# Patient Record
Sex: Female | Born: 1960 | Race: Black or African American | Hispanic: No | Smoking: Former smoker
Health system: Southern US, Community
[De-identification: ages and names within clinical notes are randomized; demographics above are authoritative.]

## PROBLEM LIST (undated history)

## (undated) DIAGNOSIS — K219 Gastro-esophageal reflux disease without esophagitis: Secondary | ICD-10-CM

## (undated) DIAGNOSIS — R198 Other specified symptoms and signs involving the digestive system and abdomen: Secondary | ICD-10-CM

## (undated) DIAGNOSIS — F419 Anxiety disorder, unspecified: Secondary | ICD-10-CM

## (undated) DIAGNOSIS — I10 Essential (primary) hypertension: Secondary | ICD-10-CM

## (undated) DIAGNOSIS — K7581 Nonalcoholic steatohepatitis (NASH): Secondary | ICD-10-CM

## (undated) DIAGNOSIS — F29 Unspecified psychosis not due to a substance or known physiological condition: Secondary | ICD-10-CM

## (undated) DIAGNOSIS — E669 Obesity, unspecified: Secondary | ICD-10-CM

## (undated) HISTORY — DX: Nonalcoholic steatohepatitis (NASH): K75.81

## (undated) HISTORY — PX: CHOLECYSTECTOMY: SHX55

## (undated) HISTORY — PX: OTHER SURGICAL HISTORY: SHX169

## (undated) HISTORY — PX: ABDOMINAL HYSTERECTOMY: SHX81

## (undated) HISTORY — PX: ABDOMINAL SURGERY: SHX537

## (undated) HISTORY — DX: Obesity, unspecified: E66.9

## (undated) HISTORY — DX: Anxiety disorder, unspecified: F41.9

## (undated) HISTORY — DX: Other specified symptoms and signs involving the digestive system and abdomen: R19.8

## (undated) HISTORY — DX: Gastro-esophageal reflux disease without esophagitis: K21.9

## (undated) HISTORY — DX: Unspecified psychosis not due to a substance or known physiological condition: F29

## (undated) HISTORY — DX: Essential (primary) hypertension: I10

---

## 2001-08-24 ENCOUNTER — Other Ambulatory Visit: Admission: RE | Admit: 2001-08-24 | Discharge: 2001-08-24 | Payer: Self-pay | Admitting: Family Medicine

## 2001-10-21 ENCOUNTER — Ambulatory Visit (HOSPITAL_COMMUNITY): Admission: RE | Admit: 2001-10-21 | Discharge: 2001-10-21 | Payer: Self-pay | Admitting: Family Medicine

## 2001-10-21 ENCOUNTER — Encounter: Payer: Self-pay | Admitting: Family Medicine

## 2003-06-20 ENCOUNTER — Ambulatory Visit (HOSPITAL_COMMUNITY): Admission: RE | Admit: 2003-06-20 | Discharge: 2003-06-20 | Payer: Self-pay | Admitting: Family Medicine

## 2004-05-14 ENCOUNTER — Ambulatory Visit: Payer: Self-pay | Admitting: Family Medicine

## 2004-06-18 ENCOUNTER — Ambulatory Visit: Payer: Self-pay | Admitting: Family Medicine

## 2004-07-25 ENCOUNTER — Ambulatory Visit: Payer: Self-pay | Admitting: Family Medicine

## 2004-08-07 ENCOUNTER — Ambulatory Visit (HOSPITAL_COMMUNITY): Admission: RE | Admit: 2004-08-07 | Discharge: 2004-08-07 | Payer: Self-pay | Admitting: Family Medicine

## 2004-10-08 ENCOUNTER — Ambulatory Visit: Payer: Self-pay | Admitting: Family Medicine

## 2004-10-10 ENCOUNTER — Emergency Department (HOSPITAL_COMMUNITY): Admission: EM | Admit: 2004-10-10 | Discharge: 2004-10-10 | Payer: Self-pay | Admitting: Emergency Medicine

## 2004-10-14 ENCOUNTER — Ambulatory Visit: Payer: Self-pay | Admitting: Psychiatry

## 2004-11-18 ENCOUNTER — Ambulatory Visit: Payer: Self-pay | Admitting: Family Medicine

## 2004-11-18 ENCOUNTER — Inpatient Hospital Stay (HOSPITAL_COMMUNITY): Admission: AD | Admit: 2004-11-18 | Discharge: 2004-11-26 | Payer: Self-pay | Admitting: Urology

## 2004-12-12 ENCOUNTER — Ambulatory Visit: Payer: Self-pay | Admitting: Psychiatry

## 2004-12-13 ENCOUNTER — Ambulatory Visit: Payer: Self-pay | Admitting: Family Medicine

## 2005-07-08 ENCOUNTER — Ambulatory Visit: Payer: Self-pay | Admitting: Family Medicine

## 2005-11-18 ENCOUNTER — Emergency Department (HOSPITAL_COMMUNITY): Admission: EM | Admit: 2005-11-18 | Discharge: 2005-11-18 | Payer: Self-pay | Admitting: Emergency Medicine

## 2005-11-19 ENCOUNTER — Encounter: Payer: Self-pay | Admitting: Family Medicine

## 2005-11-19 ENCOUNTER — Other Ambulatory Visit: Admission: RE | Admit: 2005-11-19 | Discharge: 2005-11-19 | Payer: Self-pay | Admitting: Family Medicine

## 2005-11-19 ENCOUNTER — Ambulatory Visit: Payer: Self-pay | Admitting: Family Medicine

## 2005-11-20 ENCOUNTER — Ambulatory Visit (HOSPITAL_COMMUNITY): Admission: RE | Admit: 2005-11-20 | Discharge: 2005-11-20 | Payer: Self-pay | Admitting: Family Medicine

## 2006-03-23 ENCOUNTER — Ambulatory Visit: Payer: Self-pay | Admitting: Family Medicine

## 2006-06-17 ENCOUNTER — Ambulatory Visit: Payer: Self-pay | Admitting: Family Medicine

## 2006-07-07 ENCOUNTER — Emergency Department (HOSPITAL_COMMUNITY): Admission: EM | Admit: 2006-07-07 | Discharge: 2006-07-07 | Payer: Self-pay | Admitting: Emergency Medicine

## 2006-07-30 ENCOUNTER — Ambulatory Visit: Payer: Self-pay | Admitting: Family Medicine

## 2006-08-27 ENCOUNTER — Inpatient Hospital Stay (HOSPITAL_COMMUNITY): Admission: RE | Admit: 2006-08-27 | Discharge: 2006-08-29 | Payer: Self-pay | Admitting: Obstetrics and Gynecology

## 2006-08-27 ENCOUNTER — Encounter (INDEPENDENT_AMBULATORY_CARE_PROVIDER_SITE_OTHER): Payer: Self-pay | Admitting: Specialist

## 2006-11-16 ENCOUNTER — Encounter: Payer: Self-pay | Admitting: Family Medicine

## 2006-11-16 LAB — CONVERTED CEMR LAB
BUN: 13 mg/dL (ref 6–23)
CO2: 23 meq/L (ref 19–32)
Chloride: 108 meq/L (ref 96–112)
Eosinophils Absolute: 0.1 10*3/uL (ref 0.0–0.7)
Glucose, Bld: 94 mg/dL (ref 70–99)
HCT: 40.8 % (ref 36.0–46.0)
Hemoglobin: 13.6 g/dL (ref 12.0–15.0)
LDL Cholesterol: 115 mg/dL — ABNORMAL HIGH (ref 0–99)
MCHC: 33.3 g/dL (ref 30.0–36.0)
Neutrophils Relative %: 30 % — ABNORMAL LOW (ref 43–77)
Platelets: 274 10*3/uL (ref 150–400)
Potassium: 3.5 meq/L (ref 3.5–5.3)
RBC: 4.42 M/uL (ref 3.87–5.11)
RDW: 13.1 % (ref 11.5–14.0)
Sodium: 143 meq/L (ref 135–145)
TSH: 0.761 microintl units/mL
TSH: 0.761 microintl units/mL (ref 0.350–5.50)
Total CHOL/HDL Ratio: 3.5
WBC: 5.5 10*3/uL (ref 4.0–10.5)

## 2006-11-24 ENCOUNTER — Encounter: Payer: Self-pay | Admitting: Family Medicine

## 2006-11-24 ENCOUNTER — Ambulatory Visit: Payer: Self-pay | Admitting: Family Medicine

## 2006-11-24 ENCOUNTER — Other Ambulatory Visit: Admission: RE | Admit: 2006-11-24 | Discharge: 2006-11-24 | Payer: Self-pay | Admitting: Family Medicine

## 2006-11-24 LAB — CONVERTED CEMR LAB: Pap Smear: NORMAL

## 2006-11-25 ENCOUNTER — Encounter: Payer: Self-pay | Admitting: Family Medicine

## 2006-11-25 LAB — CONVERTED CEMR LAB
Chlamydia, DNA Probe: NEGATIVE
Gardnerella vaginalis: NEGATIVE

## 2006-11-30 ENCOUNTER — Ambulatory Visit (HOSPITAL_COMMUNITY): Admission: RE | Admit: 2006-11-30 | Discharge: 2006-11-30 | Payer: Self-pay | Admitting: Family Medicine

## 2007-04-09 ENCOUNTER — Ambulatory Visit: Payer: Self-pay | Admitting: Family Medicine

## 2007-04-26 ENCOUNTER — Ambulatory Visit: Payer: Self-pay | Admitting: Family Medicine

## 2007-07-20 ENCOUNTER — Encounter: Payer: Self-pay | Admitting: Family Medicine

## 2007-07-20 DIAGNOSIS — F99 Mental disorder, not otherwise specified: Secondary | ICD-10-CM | POA: Insufficient documentation

## 2007-07-20 DIAGNOSIS — I10 Essential (primary) hypertension: Secondary | ICD-10-CM | POA: Insufficient documentation

## 2007-07-20 DIAGNOSIS — F411 Generalized anxiety disorder: Secondary | ICD-10-CM | POA: Insufficient documentation

## 2007-08-27 ENCOUNTER — Ambulatory Visit: Payer: Self-pay | Admitting: Family Medicine

## 2007-08-27 LAB — CONVERTED CEMR LAB: Helicobacter Pylori Antibody-IgG: 0.8

## 2007-09-29 ENCOUNTER — Ambulatory Visit: Payer: Self-pay | Admitting: Gastroenterology

## 2007-10-12 ENCOUNTER — Ambulatory Visit: Payer: Self-pay | Admitting: Gastroenterology

## 2007-10-18 ENCOUNTER — Ambulatory Visit: Payer: Self-pay | Admitting: Gastroenterology

## 2007-10-29 HISTORY — PX: ESOPHAGOGASTRODUODENOSCOPY: SHX1529

## 2007-11-16 ENCOUNTER — Ambulatory Visit: Payer: Self-pay | Admitting: Gastroenterology

## 2007-11-17 ENCOUNTER — Ambulatory Visit (HOSPITAL_COMMUNITY): Admission: RE | Admit: 2007-11-17 | Discharge: 2007-11-17 | Payer: Self-pay | Admitting: Gastroenterology

## 2007-11-23 ENCOUNTER — Encounter: Payer: Self-pay | Admitting: Gastroenterology

## 2007-11-23 ENCOUNTER — Ambulatory Visit: Payer: Self-pay | Admitting: Gastroenterology

## 2007-11-23 ENCOUNTER — Ambulatory Visit (HOSPITAL_COMMUNITY): Admission: RE | Admit: 2007-11-23 | Discharge: 2007-11-23 | Payer: Self-pay | Admitting: Gastroenterology

## 2007-12-10 ENCOUNTER — Ambulatory Visit: Payer: Self-pay | Admitting: Family Medicine

## 2007-12-10 LAB — CONVERTED CEMR LAB
Calcium: 9.6 mg/dL (ref 8.4–10.5)
Sodium: 141 meq/L (ref 135–145)
Total CHOL/HDL Ratio: 3.8
Triglycerides: 85 mg/dL (ref <150)
VLDL: 17 mg/dL (ref 0–40)

## 2008-02-24 ENCOUNTER — Encounter: Payer: Self-pay | Admitting: Family Medicine

## 2008-02-24 ENCOUNTER — Ambulatory Visit: Payer: Self-pay | Admitting: Gastroenterology

## 2008-03-14 ENCOUNTER — Ambulatory Visit: Payer: Self-pay | Admitting: Family Medicine

## 2008-03-14 DIAGNOSIS — F29 Unspecified psychosis not due to a substance or known physiological condition: Secondary | ICD-10-CM | POA: Insufficient documentation

## 2008-03-16 DIAGNOSIS — K219 Gastro-esophageal reflux disease without esophagitis: Secondary | ICD-10-CM | POA: Insufficient documentation

## 2008-05-15 DIAGNOSIS — R7401 Elevation of levels of liver transaminase levels: Secondary | ICD-10-CM | POA: Insufficient documentation

## 2008-05-15 DIAGNOSIS — R740 Nonspecific elevation of levels of transaminase and lactic acid dehydrogenase [LDH]: Secondary | ICD-10-CM

## 2008-05-15 DIAGNOSIS — R109 Unspecified abdominal pain: Secondary | ICD-10-CM | POA: Insufficient documentation

## 2008-05-15 DIAGNOSIS — K7689 Other specified diseases of liver: Secondary | ICD-10-CM | POA: Insufficient documentation

## 2008-05-18 ENCOUNTER — Encounter: Payer: Self-pay | Admitting: Gastroenterology

## 2008-05-18 LAB — CONVERTED CEMR LAB
Albumin: 5 g/dL (ref 3.5–5.2)
Bilirubin, Direct: 0.1 mg/dL (ref 0.0–0.3)
Total Bilirubin: 0.5 mg/dL (ref 0.3–1.2)

## 2008-06-01 ENCOUNTER — Encounter: Payer: Self-pay | Admitting: Family Medicine

## 2008-06-30 HISTORY — PX: COLONOSCOPY: SHX174

## 2008-07-04 ENCOUNTER — Encounter: Payer: Self-pay | Admitting: Family Medicine

## 2008-07-06 ENCOUNTER — Ambulatory Visit: Payer: Self-pay | Admitting: Gastroenterology

## 2008-07-06 LAB — CONVERTED CEMR LAB
ALT: 37 units/L — ABNORMAL HIGH (ref 0–35)
AST: 23 units/L (ref 0–37)
Alkaline Phosphatase: 81 units/L (ref 39–117)
Bilirubin, Direct: 0.1 mg/dL (ref 0.0–0.3)
HCT: 42 % (ref 36.0–46.0)
Indirect Bilirubin: 0.3 mg/dL (ref 0.0–0.9)
Total Protein: 7.7 g/dL (ref 6.0–8.3)

## 2008-07-10 ENCOUNTER — Ambulatory Visit: Payer: Self-pay | Admitting: Gastroenterology

## 2008-07-10 ENCOUNTER — Ambulatory Visit (HOSPITAL_COMMUNITY): Admission: RE | Admit: 2008-07-10 | Discharge: 2008-07-10 | Payer: Self-pay | Admitting: Gastroenterology

## 2008-07-11 ENCOUNTER — Ambulatory Visit (HOSPITAL_COMMUNITY): Admission: RE | Admit: 2008-07-11 | Discharge: 2008-07-11 | Payer: Self-pay | Admitting: Gastroenterology

## 2008-07-12 ENCOUNTER — Ambulatory Visit: Payer: Self-pay | Admitting: Gastroenterology

## 2008-07-12 HISTORY — PX: OTHER SURGICAL HISTORY: SHX169

## 2008-07-24 ENCOUNTER — Ambulatory Visit: Payer: Self-pay | Admitting: Family Medicine

## 2008-08-07 ENCOUNTER — Ambulatory Visit: Payer: Self-pay | Admitting: Family Medicine

## 2008-08-15 ENCOUNTER — Telehealth: Payer: Self-pay | Admitting: Family Medicine

## 2008-08-16 ENCOUNTER — Encounter: Payer: Self-pay | Admitting: Family Medicine

## 2008-09-04 ENCOUNTER — Encounter: Payer: Self-pay | Admitting: Family Medicine

## 2008-09-13 ENCOUNTER — Ambulatory Visit: Payer: Self-pay | Admitting: Gastroenterology

## 2008-10-02 ENCOUNTER — Encounter: Payer: Self-pay | Admitting: Family Medicine

## 2008-10-27 ENCOUNTER — Encounter: Payer: Self-pay | Admitting: Family Medicine

## 2008-11-02 ENCOUNTER — Encounter: Payer: Self-pay | Admitting: Gastroenterology

## 2008-11-28 ENCOUNTER — Encounter: Payer: Self-pay | Admitting: Family Medicine

## 2008-12-20 ENCOUNTER — Ambulatory Visit: Payer: Self-pay | Admitting: Family Medicine

## 2008-12-25 ENCOUNTER — Ambulatory Visit (HOSPITAL_COMMUNITY): Admission: RE | Admit: 2008-12-25 | Discharge: 2008-12-25 | Payer: Self-pay | Admitting: Family Medicine

## 2008-12-25 ENCOUNTER — Encounter: Payer: Self-pay | Admitting: Family Medicine

## 2008-12-25 DIAGNOSIS — R5381 Other malaise: Secondary | ICD-10-CM | POA: Insufficient documentation

## 2008-12-25 DIAGNOSIS — R5383 Other fatigue: Secondary | ICD-10-CM

## 2008-12-26 LAB — CONVERTED CEMR LAB
Albumin: 4.8 g/dL (ref 3.5–5.2)
CO2: 20 meq/L (ref 19–32)
Calcium: 9.7 mg/dL (ref 8.4–10.5)
Chloride: 106 meq/L (ref 96–112)
Cholesterol: 188 mg/dL (ref 0–200)
Indirect Bilirubin: 0.2 mg/dL (ref 0.0–0.9)
LDL Cholesterol: 111 mg/dL — ABNORMAL HIGH (ref 0–99)
Sodium: 140 meq/L (ref 135–145)
Total Bilirubin: 0.3 mg/dL (ref 0.3–1.2)
Total CHOL/HDL Ratio: 3.2
Triglycerides: 97 mg/dL (ref <150)
VLDL: 19 mg/dL (ref 0–40)

## 2008-12-29 ENCOUNTER — Encounter: Payer: Self-pay | Admitting: Family Medicine

## 2009-01-30 ENCOUNTER — Encounter: Payer: Self-pay | Admitting: Family Medicine

## 2009-02-02 ENCOUNTER — Encounter: Payer: Self-pay | Admitting: Family Medicine

## 2009-02-26 ENCOUNTER — Encounter: Payer: Self-pay | Admitting: Gastroenterology

## 2009-02-28 ENCOUNTER — Encounter: Payer: Self-pay | Admitting: Family Medicine

## 2009-03-09 ENCOUNTER — Encounter: Payer: Self-pay | Admitting: Gastroenterology

## 2009-03-22 ENCOUNTER — Ambulatory Visit: Payer: Self-pay | Admitting: Family Medicine

## 2009-04-01 DIAGNOSIS — E669 Obesity, unspecified: Secondary | ICD-10-CM

## 2009-04-01 DIAGNOSIS — E663 Overweight: Secondary | ICD-10-CM | POA: Insufficient documentation

## 2009-04-18 ENCOUNTER — Encounter (INDEPENDENT_AMBULATORY_CARE_PROVIDER_SITE_OTHER): Payer: Self-pay

## 2009-04-27 ENCOUNTER — Encounter: Payer: Self-pay | Admitting: Family Medicine

## 2009-04-27 ENCOUNTER — Encounter: Payer: Self-pay | Admitting: Gastroenterology

## 2009-05-10 ENCOUNTER — Encounter: Payer: Self-pay | Admitting: Family Medicine

## 2009-05-31 ENCOUNTER — Encounter: Payer: Self-pay | Admitting: Family Medicine

## 2009-06-28 ENCOUNTER — Encounter: Payer: Self-pay | Admitting: Family Medicine

## 2009-08-31 ENCOUNTER — Encounter: Payer: Self-pay | Admitting: Family Medicine

## 2009-10-01 ENCOUNTER — Ambulatory Visit: Payer: Self-pay | Admitting: Family Medicine

## 2009-10-01 DIAGNOSIS — E8941 Symptomatic postprocedural ovarian failure: Secondary | ICD-10-CM | POA: Insufficient documentation

## 2010-01-29 ENCOUNTER — Encounter (INDEPENDENT_AMBULATORY_CARE_PROVIDER_SITE_OTHER): Payer: Self-pay | Admitting: *Deleted

## 2010-01-30 LAB — CONVERTED CEMR LAB
AST: 22 units/L (ref 0–37)
Albumin: 4.9 g/dL (ref 3.5–5.2)
BUN: 16 mg/dL (ref 6–23)
CO2: 20 meq/L (ref 19–32)
Calcium: 9.8 mg/dL (ref 8.4–10.5)
Cholesterol: 194 mg/dL (ref 0–200)
HCT: 41 % (ref 36.0–46.0)
HDL: 57 mg/dL (ref >39)
Hemoglobin: 14.1 g/dL (ref 12.0–15.0)
LDL Cholesterol: 123 mg/dL — ABNORMAL HIGH (ref 0–99)
MCHC: 34.4 g/dL (ref 30.0–36.0)
MCV: 92.8 fL (ref 78.0–100.0)
Platelets: 280 10*3/uL (ref 150–400)
RBC: 4.42 M/uL (ref 3.87–5.11)
RDW: 12.8 % (ref 11.5–15.5)
Total CHOL/HDL Ratio: 3.4
VLDL: 14 mg/dL (ref 0–40)
Vit D, 25-Hydroxy: 42 ng/mL (ref 30–89)

## 2010-02-01 ENCOUNTER — Ambulatory Visit: Payer: Self-pay | Admitting: Family Medicine

## 2010-02-03 DIAGNOSIS — E785 Hyperlipidemia, unspecified: Secondary | ICD-10-CM | POA: Insufficient documentation

## 2010-02-05 ENCOUNTER — Ambulatory Visit (HOSPITAL_COMMUNITY): Admission: RE | Admit: 2010-02-05 | Discharge: 2010-02-05 | Payer: Self-pay | Admitting: Family Medicine

## 2010-02-05 ENCOUNTER — Ambulatory Visit: Payer: Self-pay | Admitting: Family Medicine

## 2010-02-27 ENCOUNTER — Encounter: Payer: Self-pay | Admitting: Gastroenterology

## 2010-03-05 ENCOUNTER — Telehealth (INDEPENDENT_AMBULATORY_CARE_PROVIDER_SITE_OTHER): Payer: Self-pay

## 2010-04-03 ENCOUNTER — Encounter: Payer: Self-pay | Admitting: Gastroenterology

## 2010-04-30 LAB — CONVERTED CEMR LAB: OCCULT 1: NEGATIVE

## 2010-05-10 ENCOUNTER — Ambulatory Visit: Payer: Self-pay | Admitting: Family Medicine

## 2010-05-10 ENCOUNTER — Other Ambulatory Visit: Admission: RE | Admit: 2010-05-10 | Discharge: 2010-05-10 | Payer: Self-pay | Admitting: Family Medicine

## 2010-05-29 ENCOUNTER — Encounter (INDEPENDENT_AMBULATORY_CARE_PROVIDER_SITE_OTHER): Payer: Self-pay | Admitting: *Deleted

## 2010-07-09 ENCOUNTER — Telehealth: Payer: Self-pay | Admitting: Family Medicine

## 2010-07-15 ENCOUNTER — Encounter: Payer: Self-pay | Admitting: Family Medicine

## 2010-07-30 NOTE — Assessment & Plan Note (Signed)
Summary: follow up- room 1   Vital Signs:  Patient profile:   50 year old female Menstrual status:  hysterectomy Height:      64 inches Weight:      156.25 pounds BMI:     26.92 O2 Sat:      98 % on Room air Pulse rate:   93 / minute Resp:     16 per minute BP sitting:   124 / 70  (left arm)  Vitals Entered By: Adella Hare LPN (October 01, 452 10:27 AM) CC: follow up Is Patient Diabetic? No Pain Assessment Patient in pain? no        Primary Provider:  Syliva Overman, MD  CC:  follow up.  History of Present Illness: Pt is here for follow up of chronic problems.  She is accompanied by her caregiver. Eye & dental exams UTD. No current complaints/concerns. Has been feeling well.  She denies any chest pain, palp, difficulty breathing, abd pain, or constipation. she does need a refill of her medications.  Current Medications (verified): 1)  Clonazepam Odt 0.5 Mg  Tbdp (Clonazepam) .... Take One Tablet By Mouth Twice A Day 2)  Topamax 100 Mg  Tabs (Topiramate) .... Take One Tablet By Mouth Once A Day 3)  Zyprexa 7.5 Mg  Tabs (Olanzapine) .... Take One Tablet By Mouth At Bedtime 4)  Estradiol 1 Mg  Tabs (Estradiol) .... Take One Tablet By Mouth Once A Day 5)  Dexilant 60 Mg Cpdr (Dexlansoprazole) .... One By Mouth Daily 6)  Docusate Sodium 100 Mg Caps (Docusate Sodium) .... One Cap By Mouth Two Times A Day 7)  Hydrochlorothiazide 12.5 Mg Caps (Hydrochlorothiazide) .... One Cap By Mouth Qd 8)  Citracal/vitamin D 250-200 Mg-Unit Tabs (Calcium Citrate-Vitamin D) .... Two Tabs By Mouth Bid 9)  Benefiber  Tabs (Wheat Dextrin) .... One By Mouth Twice A Day 10)  Lisinopril 20 Mg Tabs (Lisinopril) .... One Tab By Mouth Qd  Allergies (verified): No Known Drug Allergies  Past History:  Past medical history reviewed for relevance to current acute and chronic problems.  Past Medical History: Reviewed history from 07/20/2007 and no changes required. hypertension psychotic  disorder anxiety mild obesity  Review of Systems General:  Denies chills, fever, and loss of appetite. ENT:  Denies earache, nasal congestion, and sore throat. CV:  Denies chest pain or discomfort and palpitations. Resp:  Denies cough and shortness of breath. GI:  Denies abdominal pain, change in bowel habits, constipation, indigestion, nausea, and vomiting. GU:  Denies dysuria, incontinence, and urinary frequency. Psych:  Denies anxiety and irritability.  Physical Exam  General:  Well-developed,well-nourished,in no acute distress; alert,appropriate and cooperative throughout examination Head:  Normocephalic and atraumatic without obvious abnormalities. No apparent alopecia or balding. Ears:  External ear exam shows no significant lesions or deformities.  Otoscopic examination reveals clear canals, tympanic membranes are intact bilaterally without bulging, retraction, inflammation or discharge. Hearing is grossly normal bilaterally. Nose:  External nasal examination shows no deformity or inflammation. Nasal mucosa are pink and moist without lesions or exudates. Mouth:  Oral mucosa and oropharynx without lesions or exudates.   Neck:  No deformities, masses, or tenderness noted.no thyromegaly.   Lungs:  Normal respiratory effort, chest expands symmetrically. Lungs are clear to auscultation, no crackles or wheezes. Heart:  Normal rate and regular rhythm. S1 and S2 normal without gallop, murmur, click, rub or other extra sounds. Abdomen:  Bowel sounds positive,abdomen soft and non-tender without masses, organomegaly or hernias  noted. Pulses:  R radial normal, R posterior tibial normal, R dorsalis pedis normal, L radial normal, L posterior tibial normal, and L dorsalis pedis normal.   Extremities:  No clubbing, cyanosis, edema, or deformity noted with normal full range of motion of all joints.   Neurologic:  sensation intact to light touch, gait normal, and DTRs symmetrical and normal.   Skin:   Intact without suspicious lesions or rashes Cervical Nodes:  No lymphadenopathy noted Psych:  good eye contact and not anxious appearing.     Impression & Recommendations:  Problem # 1:  ESSENTIAL HYPERTENSION, BENIGN (ICD-401.1) Assessment Unchanged  Her updated medication list for this problem includes:    Hydrochlorothiazide 12.5 Mg Caps (Hydrochlorothiazide) ..... One cap by mouth qd    Lisinopril 20 Mg Tabs (Lisinopril) ..... One tab by mouth qd  BP today: 124/70 Prior BP: 120/80 (03/22/2009)  Labs Reviewed: K+: 3.9 (12/25/2008) Creat: : 1.02 (12/25/2008)   Chol: 188 (12/25/2008)   HDL: 58 (12/25/2008)   LDL: 111 (12/25/2008)   TG: 97 (12/25/2008)  Orders: T-Comprehensive Metabolic Panel ([id number])  Problem # 2:  MENOPAUSE, SURGICAL (ICD-627.4) Will try decreasing her estradiol dosage.  The following medications were removed from the medication list:    Estradiol 1 Mg Tabs (Estradiol) .Marland Kitchen... Take one tablet by mouth once a day Her updated medication list for this problem includes:    Estradiol 0.5 Mg Tabs (Estradiol) .Marland Kitchen... Take 1 daily  Problem # 3:  GERD (ICD-530.81) Assessment: Improved  Her updated medication list for this problem includes:    Dexilant 60 Mg Cpdr (Dexlansoprazole) ..... One by mouth daily  Problem # 4:  OVERWEIGHT (ICD-278.02) Assessment: Deteriorated Discussed with caregiver need to improve diet to help with wt loss. Discussed d/c sweets & decrease carbs.  Try to increase her veggie & fruit intake. Orders: T-Lipid Profile ([id number])  Ht: 64 (10/01/2009)   Wt: 156.25 (10/01/2009)   BMI: 26.92 (10/01/2009)  Complete Medication List: 1)  Clonazepam Odt 0.5 Mg Tbdp (Clonazepam) .... Take one tablet by mouth twice a day 2)  Topamax 100 Mg Tabs (Topiramate) .... Take one tablet by mouth once a day 3)  Zyprexa 7.5 Mg Tabs (Olanzapine) .... Take one tablet by mouth at bedtime 4)  Dexilant 60 Mg Cpdr (Dexlansoprazole) .... One by mouth  daily 5)  Docusate Sodium 100 Mg Caps (Docusate sodium) .... One cap by mouth two times a day 6)  Hydrochlorothiazide 12.5 Mg Caps (Hydrochlorothiazide) .... One cap by mouth qd 7)  Citracal/vitamin D 250-200 Mg-unit Tabs (Calcium citrate-vitamin d) .... Two tabs by mouth bid 8)  Benefiber Tabs (Wheat dextrin) .... One by mouth twice a day 9)  Lisinopril 20 Mg Tabs (Lisinopril) .... One tab by mouth qd 10)  Estradiol 0.5 Mg Tabs (Estradiol) .... Take 1 daily  Other Orders: T-CBC No Diff ([id number]) T-TSH [phone]) T-Vitamin D (25-Hydroxy) [phone])  Patient Instructions: 1)  Please schedule a follow-up appointment in 4 months. 2)  It is important that you exercise regularly at least 20 minutes 5 times a week. If you develop chest pain, have severe difficulty breathing, or feel very tired , stop exercising immediately and seek medical attention. 3)  You need to lose weight. Consider a lower calorie diet and regular exercise.  4)  I have lowered your Estradiol dosage from 1.0 mg to  0.5 mg daily. 5)  I have ordered blood work for you to have drawn fasting. Prescriptions: CLONAZEPAM ODT 0.5 MG  TBDP (CLONAZEPAM) Take  one tablet by mouth twice a day  #60 x 5   Entered and Authorized by:   Esperanza Sheets PA   Signed by:   Esperanza Sheets PA on 10/01/2009   Method used:   Printed then faxed to ...       [facility] (retail)       [address]       [place], [place]  [address]       Ph: [phone]       Fax: [phone]   RxID:   [id number] HYDROCHLOROTHIAZIDE 12.5 MG CAPS (HYDROCHLOROTHIAZIDE) ONE CAP by mouth QD  #30 x 5   Entered and Authorized by:   Esperanza Sheets PA   Signed by:   Esperanza Sheets PA on 10/01/2009   Method used:   Electronically to        [facility] (retail)       [address]       [place], [place]  [address]       Ph: [phone]       Fax: [phone]   RxID:    [id number] LISINOPRIL 20 MG TABS (LISINOPRIL) one tab by mouth qd  #30 x 5   Entered and Authorized by:   Esperanza Sheets PA   Signed by:   Esperanza Sheets PA on 10/01/2009   Method used:   Electronically to        [facility] (retail)       [address]       [place], [place]  [address]       Ph: [phone]       Fax: [phone]   RxID:   [id number] ZYPREXA 7.5 MG  TABS (OLANZAPINE) Take one tablet by mouth at bedtime  #30 x 5   Entered and Authorized by:   Esperanza Sheets PA   Signed by:   Esperanza Sheets PA on 10/01/2009   Method used:   Electronically to        [facility] (retail)       [address]       [place], [place]  [address]       Ph: [phone]       Fax: [phone]   RxID:   [id number] TOPAMAX 100 MG  TABS (TOPIRAMATE) Take one tablet by mouth once a day  #30 x 5   Entered and Authorized by:   Esperanza Sheets PA   Signed by:   Esperanza Sheets PA on 10/01/2009   Method used:   Electronically to        [facility] (retail)       [address]       [place], [place]  [address]       Ph: [phone]       Fax: [phone]   RxID:   [id number] ESTRADIOL 0.5 MG TABS (ESTRADIOL) take 1 daily  #30 x 5   Entered and Authorized by:   Esperanza Sheets PA   Signed by:   Esperanza Sheets PA on 10/01/2009   Method used:   Electronically to        [facility] (retail)       [address]  [place], [place]  [address]       Ph: [phone]       Fax: [phone]   RxID:   [id number]

## 2010-07-30 NOTE — Medication Information (Signed)
Summary: Berna Bue   Imported By: Rexene Alberts 01/29/2010 13:51:42  _____________________________________________________________________  External Attachment:    Type:   Image     Comment:   External Document

## 2010-07-30 NOTE — Assessment & Plan Note (Signed)
Summary: physical/slj   Vital Signs:  Patient profile:   50 year old female Menstrual status:  hysterectomy Height:      64 inches Weight:      153.25 pounds O2 Sat:      97 % on Room air Pulse rate:   92 / minute Pulse rhythm:   regular Resp:     16 per minute BP sitting:   110 / 80  (right arm)  Vitals Entered By: Louie Casa CMA (May 10, 2010 10:50 AM)  O2 Flow:  Room air CC: physical   Primary Care Provider:  Tula Nakayama, MD  CC:  physical.  History of Present Illness: Reports  that she has been  doing well. Denies recent fever or chills. Denies sinus pressure, nasal congestion , ear pain or sore throat. Denies chest congestion, or cough productive of sputum. Denies chest pain, palpitations, PND, orthopnea or leg swelling. Denies abdominal pain, nausea, vomitting, diarrhea or constipation. Denies change in bowel movements or bloody stool. Denies dysuria , frequency, incontinence or hesitancy. Denies  joint pain, swelling, or reduced mobility. Denies headaches, vertigo, seizures. Denies depression, anxiety or insomnia. Denies  rash, lesions, or itch.     Current Medications (verified): 1)  Clonazepam Odt 0.5 Mg  Tbdp (Clonazepam) .... Take One Tablet By Mouth Twice A Day 2)  Topamax 100 Mg  Tabs (Topiramate) .... Take One Tablet By Mouth Once A Day 3)  Zyprexa 7.5 Mg  Tabs (Olanzapine) .... Take One Tablet By Mouth At Bedtime 4)  Dexilant 60 Mg Cpdr (Dexlansoprazole) .... One By Mouth Daily 5)  Docusate Sodium 100 Mg Caps (Docusate Sodium) .... One Cap By Mouth Two Times A Day 6)  Hydrochlorothiazide 12.5 Mg Caps (Hydrochlorothiazide) .... One Cap By Mouth Qd 7)  Citracal/vitamin D 250-200 Mg-Unit Tabs (Calcium Citrate-Vitamin D) .... Two Tabs By Mouth Bid 8)  Benefiber  Tabs (Wheat Dextrin) .... One By Mouth Twice A Day 9)  Lisinopril 20 Mg Tabs (Lisinopril) .... One Tab By Mouth Qd 10)  Estradiol 0.5 Mg Tabs (Estradiol) .... Take 1 Daily 11)   Travatan Z 0.004 % Soln (Travoprost) .... One Drop in Each Eye At Bedtime  Allergies (verified): No Known Drug Allergies  Review of Systems      See HPI General:  Complains of fatigue. Eyes:  Complains of vision loss-both eyes; wears corrective lenses. Endo:  Denies cold intolerance, excessive hunger, excessive thirst, excessive urination, and heat intolerance. Heme:  Denies abnormal bruising and bleeding. Allergy:  Complains of seasonal allergies.  Physical Exam  General:  Well-developed,well-nourished,in no acute distress; alert,appropriate and cooperative throughout examination Head:  Normocephalic and atraumatic without obvious abnormalities. No apparent alopecia or balding. Eyes:  No corneal or conjunctival inflammation noted. EOMI. Perrla. Funduscopic exam benign, without hemorrhages or  exudates. Vision grossly normal. Ears:  External ear exam shows no significant lesions or deformities.  Otoscopic examination reveals clear canals, tympanic membranes are intact bilaterally without bulging, retraction, inflammation or discharge. Hearing is grossly normal bilaterally. Nose:  External nasal examination shows no deformity or inflammation. Nasal mucosa are pink and moist without lesions or exudates. Mouth:  pharynx pink and moist and fair dentition.   Neck:  No deformities, masses, or tenderness noted. Chest Wall:  No deformities, masses, or tenderness noted. Breasts:  No mass, nodules, thickening, tenderness, bulging, retraction, inflamation, nipple discharge or skin changes noted.   Lungs:  Normal respiratory effort, chest expands symmetrically. Lungs are clear to auscultation, no crackles or wheezes.  Heart:  Normal rate and regular rhythm. S1 and S2 normal without gallop, murmur, click, rub or other extra sounds. Abdomen:  Bowel sounds positive,abdomen soft and non-tender without masses, organomegaly or hernias noted. Rectal:  No external abnormalities noted. Normal sphincter tone. No  rectal masses or tenderness. Genitalia:  no external lesions, no vaginal atrophy, and no adnexal masses or tenderness. Uterus absent  Msk:  No deformity or scoliosis noted of thoracic or lumbar spine.   Pulses:  R and L carotid,radial,femoral,dorsalis pedis and posterior tibial pulses are full and equal bilaterally Extremities:  No clubbing, cyanosis, edema, or deformity noted with normal full range of motion of all joints.   Neurologic:  alert & oriented X3, strength normal in all extremities, sensation intact to light touch, sensation intact to pinprick, and gait normal.   Skin:  Intact without suspicious lesions or rashes Cervical Nodes:  No lymphadenopathy noted Axillary Nodes:  No palpable lymphadenopathy Inguinal Nodes:  No significant adenopathy Psych:  Oriented X3, subdued, and slightly anxious.     Impression & Recommendations:  Problem # 1:  DYSLIPIDEMIA (ICD-272.4) Assessment Comment Only  Labs Reviewed: SGOT: 22 (01/30/2010)   SGPT: 38 (01/30/2010)   HDL:57 (01/30/2010), 58 (12/25/2008)  LDL:123 (01/30/2010), 111 (12/25/2008)  Chol:194 (01/30/2010), 188 (12/25/2008)  Trig:72 (01/30/2010), 97 (12/25/2008) Low fat dietdiscussed and encouraged  Problem # 2:  PHYSICAL EXAMINATION (ICD-V70.0) Assessment: Comment Only pap sent, rectal exam reveals guaic negative stool  Problem # 3:  ESSENTIAL HYPERTENSION, BENIGN (ICD-401.1) Assessment: Unchanged  Her updated medication list for this problem includes:    Hydrochlorothiazide 12.5 Mg Caps (Hydrochlorothiazide) ..... One cap by mouth qd    Lisinopril 20 Mg Tabs (Lisinopril) ..... One tab by mouth qd  BP today: 110/80 Prior BP: 120/74 (02/01/2010)  Labs Reviewed: K+: 3.8 (01/30/2010) Creat: : 1.08 (01/30/2010)   Chol: 194 (01/30/2010)   HDL: 57 (01/30/2010)   LDL: 123 (01/30/2010)   TG: 72 (01/30/2010)  Problem # 4:  ANXIETY (ICD-300.00) Assessment: Improved  Her updated medication list for this problem includes:     Clonazepam Odt 0.5 Mg Tbdp (Clonazepam) .Marland Kitchen... Take one tablet by mouth twice a day  Complete Medication List: 1)  Clonazepam Odt 0.5 Mg Tbdp (Clonazepam) .... Take one tablet by mouth twice a day 2)  Topamax 100 Mg Tabs (Topiramate) .... Take one tablet by mouth once a day 3)  Zyprexa 7.5 Mg Tabs (Olanzapine) .... Take one tablet by mouth at bedtime 4)  Dexilant 60 Mg Cpdr (Dexlansoprazole) .... One by mouth daily 5)  Docusate Sodium 100 Mg Caps (Docusate sodium) .... One cap by mouth two times a day 6)  Hydrochlorothiazide 12.5 Mg Caps (Hydrochlorothiazide) .... One cap by mouth qd 7)  Citracal/vitamin D 250-200 Mg-unit Tabs (Calcium citrate-vitamin d) .... Two tabs by mouth bid 8)  Benefiber Tabs (Wheat dextrin) .... One by mouth twice a day 9)  Lisinopril 20 Mg Tabs (Lisinopril) .... One tab by mouth qd 10)  Estradiol 0.5 Mg Tabs (Estradiol) .... Take 1 daily 11)  Travatan Z 0.004 % Soln (Travoprost) .... One drop in each eye at bedtime  Other Orders: Pap Smear (93570) Hemoccult Guaiac-1 spec.(in office) (82270) Influenza Vaccine NON MCR (17793)  Patient Instructions: 1)  Follow up appointment in 5.71month 2)  no med changesat this time, you are doing very well. 3)  Flu vac today   Orders Added: 1)  Est. Patient 40-64 years [[90300]2)  Pap Smear [88150] 3)  Hemoccult Guaiac-1 spec.(in office) [  82270] 4)  Influenza Vaccine NON MCR [00028]    Influenza Vaccine (to be given today)  Laboratory Results  Date/Time Received: May 10, 2010 11:19 AM  Date/Time Reported: May 10, 2010 11:19 AM   Stool - Occult Blood Hemmoccult #1: negative Date: 05/10/2010 Comments: 5030 5/14 50590 1L 03/12 Baldomero Lamy LPN  May 10, 6885 11:19 AM      Influenza Vaccine    Vaccine Type: Fluvax Non-MCR    Site: left deltoid    Mfr: novartis    Dose: 0.5 ml    Route: IM    Given by: Baldomero Lamy LPN    Exp. Date: 10/2010    Lot #: [id number]    VIS given: 01/22/10 version  given May 10, 2010.

## 2010-07-30 NOTE — Medication Information (Signed)
Summary: DEXILANT 60MG   DEXILANT 60MG    Imported By: Rexene Alberts 02/27/2010 09:44:55  _____________________________________________________________________  External Attachment:    Type:   Image     Comment:   External Document  Appended Document: DEXILANT 60MG     Prescriptions: DEXILANT 60 MG CPDR (DEXLANSOPRAZOLE) one by mouth daily  #30 x 5   Entered and Authorized by:   Leanna Battles. Dixon Boos   Signed by:   Leanna Battles Ertha Nabor PA-C on 02/27/2010   Method used:   Electronically to        [facility] (retail)       [address]       [place], [place]  [address]       Ph: [phone]       Fax: [phone]   RxID:   [id number]    SHE NEEDS OV BY 06/2010  Appended Document: DEXILANT 60MG  reminder in computer

## 2010-07-30 NOTE — Assessment & Plan Note (Signed)
Summary: F UP   Vital Signs:  Patient profile:   50 year old female Menstrual status:  hysterectomy Height:      64 inches Weight:      152.75 pounds BMI:     26.31 O2 Sat:      97 % Pulse rate:   87 / minute Pulse rhythm:   regular Resp:     16 per minute BP sitting:   120 / 74  (left arm) Cuff size:   regular  Vitals Entered By: Everitt Amber LPN (February 01, 2010 10:06 AM)  Nutrition Counseling: Patient's BMI is greater than 25 and therefore counseled on weight management options. CC: Follow up chronic problems   Primary Care Provider:  Syliva Overman, MD  CC:  Follow up chronic problems.  History of Present Illness: Reports  that she has been  doing well. Denies recent fever or chills. Denies sinus pressure, nasal congestion , ear pain or sore throat. Denies chest congestion, or cough productive of sputum. Denies chest pain, palpitations, PND, orthopnea or leg swelling. Denies abdominal pain, nausea, vomitting, diarrhea or constipation.She did have somepain this morning which was relieved with a BM. Denies change in bowel movements or bloody stool. Denies dysuria , frequency, incontinence or hesitancy. Denies  joint pain, swelling, or reduced mobility. Denies headaches, vertigo, seizures. Denies depression, anxiety or insomnia. Denies  rash, lesions, or itch.     Current Medications (verified): 1)  Clonazepam Odt 0.5 Mg  Tbdp (Clonazepam) .... Take One Tablet By Mouth Twice A Day 2)  Topamax 100 Mg  Tabs (Topiramate) .... Take One Tablet By Mouth Once A Day 3)  Zyprexa 7.5 Mg  Tabs (Olanzapine) .... Take One Tablet By Mouth At Bedtime 4)  Dexilant 60 Mg Cpdr (Dexlansoprazole) .... One By Mouth Daily 5)  Docusate Sodium 100 Mg Caps (Docusate Sodium) .... One Cap By Mouth Two Times A Day 6)  Hydrochlorothiazide 12.5 Mg Caps (Hydrochlorothiazide) .... One Cap By Mouth Qd 7)  Citracal/vitamin D 250-200 Mg-Unit Tabs (Calcium Citrate-Vitamin D) .... Two Tabs By Mouth  Bid 8)  Benefiber  Tabs (Wheat Dextrin) .... One By Mouth Twice A Day 9)  Lisinopril 20 Mg Tabs (Lisinopril) .... One Tab By Mouth Qd 10)  Estradiol 0.5 Mg Tabs (Estradiol) .... Take 1 Daily 11)  Travatan Z 0.004 % Soln (Travoprost) .... One Drop in Each Eye At Bedtime  Allergies (verified): No Known Drug Allergies  Review of Systems      See HPI General:  Complains of fatigue. Eyes:  Complains of vision loss-both eyes; denies blurring and discharge; wears corrective klenses. Psych:  Complains of mental problems; denies suicidal thoughts/plans, thoughts of violence, and unusual visions or sounds. Endo:  Denies excessive thirst and excessive urination. Heme:  Denies abnormal bruising and bleeding. Allergy:  Denies hives or rash, itching eyes, and sneezing.  Physical Exam  General:  Well-developed,well-nourished,in no acute distress; alert,appropriate and cooperative throughout examination HEENT: No facial asymmetry,  EOMI, No sinus tenderness, bilateral cerumen impaction, oropharynx  pink and moist.   Chest: Clear to auscultation bilaterally.  CVS: S1, S2, No murmurs, No S3.   Abd: Soft, Nontender.  MS: Adequate ROM spine, hips, shoulders and knees.  Ext: No edema.   CNS: CN 2-12 intact, power tone and sensation normal throughout.   Skin: Intact, no visible lesions or rashes.  Psych: Good eye contact, normal affect.  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  Impression & Recommendations:  Problem # 1:  OVERWEIGHT (ICD-278.02) Assessment Improved  Ht: 64 (02/01/2010)   Wt: 152.75 (02/01/2010)   BMI: 26.31 (02/01/2010)  Problem # 2:  ANXIETY (ICD-300.00) Assessment: Improved  Her updated medication list for this problem includes:    Clonazepam Odt 0.5 Mg Tbdp (Clonazepam) .Marland Kitchen... Take one tablet by mouth twice a day  Problem # 3:  ESSENTIAL HYPERTENSION, BENIGN (ICD-401.1) Assessment: Unchanged  Her updated medication  list for this problem includes:    Hydrochlorothiazide 12.5 Mg Caps (Hydrochlorothiazide) ..... One cap by mouth qd    Lisinopril 20 Mg Tabs (Lisinopril) ..... One tab by mouth qd  BP today: 120/74 Prior BP: 124/70 (10/01/2009)  Labs Reviewed: K+: 3.9 (12/25/2008) Creat: : 1.02 (12/25/2008)   Chol: 188 (12/25/2008)   HDL: 58 (12/25/2008)   LDL: 111 (12/25/2008)   TG: 97 (12/25/2008)  Problem # 4:  GERD (ICD-530.81) Assessment: Improved  Her updated medication list for this problem includes:    Dexilant 60 Mg Cpdr (Dexlansoprazole) ..... One by mouth daily  Problem # 5:  DYSLIPIDEMIA (ICD-272.4) Assessment: Deteriorated  Labs Reviewed: SGOT: 22 (12/25/2008)   SGPT: 30 (12/25/2008)   HDL:58 (12/25/2008), 54 (12/10/2007)  LDL:111 (12/25/2008), 134 (12/10/2007)  Chol:188 (12/25/2008), 205 (12/10/2007)  Trig:97 (12/25/2008), 85 (12/10/2007)  Complete Medication List: 1)  Clonazepam Odt 0.5 Mg Tbdp (Clonazepam) .... Take one tablet by mouth twice a day 2)  Topamax 100 Mg Tabs (Topiramate) .... Take one tablet by mouth once a day 3)  Zyprexa 7.5 Mg Tabs (Olanzapine) .... Take one tablet by mouth at bedtime 4)  Dexilant 60 Mg Cpdr (Dexlansoprazole) .... One by mouth daily 5)  Docusate Sodium 100 Mg Caps (Docusate sodium) .... One cap by mouth two times a day 6)  Hydrochlorothiazide 12.5 Mg Caps (Hydrochlorothiazide) .... One cap by mouth qd 7)  Citracal/vitamin D 250-200 Mg-unit Tabs (Calcium citrate-vitamin d) .... Two tabs by mouth bid 8)  Benefiber Tabs (Wheat dextrin) .... One by mouth twice a day 9)  Lisinopril 20 Mg Tabs (Lisinopril) .... One tab by mouth qd 10)  Estradiol 0.5 Mg Tabs (Estradiol) .... Take 1 daily 11)  Travatan Z 0.004 % Soln (Travoprost) .... One drop in each eye at bedtime  Other Orders: Radiology Referral (Radiology)  Patient Instructions: 1)  cPE and pap in 3 months. 2)  nurse visit for ear irrigation  3)  n ext Tuesday morning around 10amIt is important  that you exercise regularly at least 20 minutes 5 times a week. If you develop chest pain, have severe difficulty breathing, or feel very tired , stop exercising immediately and seek medical attention. 4)  You need to lose weight. Consider a lower calorie diet and regular exercise.  5)  you need to cut back on fried and fatty foods. 6)  [place]amo to be scheduled 7)  no med changes at this time.

## 2010-07-30 NOTE — Letter (Signed)
Summary: Recall Office Visit  [facility]  [address]   [place], [place] [address]   Phone: [phone]  Fax: [phone]      May 29, 2010   NIKKY DUBA [address] [place], [place]  [address] 11-Oct-1960   Dear Ms. Dascoli,   According to our records, it is time for you to schedule a follow-up office visit with [place].   At your convenience, please call [phone] to schedule an office visit. If you have any questions, concerns, or feel that this letter is in error, we would appreciate your call.   Sincerely,    Diana Eves  [facility] Ph: [phone]   Fax: [phone]

## 2010-07-30 NOTE — Miscellaneous (Signed)
Summary: Home Care Report  Home Care Report   Imported By: Lind Guest 08/31/2009 16:16:36  _____________________________________________________________________  External Attachment:    Type:   Image     Comment:   External Document

## 2010-07-30 NOTE — Progress Notes (Signed)
Summary: phone note/ PA needed for Dexilant/ samples given  Phone Note From Pharmacy   Caller: Christy @ [facility] Summary of Call: T/C from [facility] @ [facility], pt needs Dexilant. PA in the works. Told her i will leave some samples at front for pick-up. She said she would call and let pt know. #10 samples of Dexilant at front for pick up. Initial call taken by: Cloria Spring LPN,  March 05, 2010 3:24 PM

## 2010-07-30 NOTE — Miscellaneous (Signed)
Summary: Home Care Report  Home Care Report   Imported By: Lind Guest 10/02/2009 14:12:39  _____________________________________________________________________  External Attachment:    Type:   Image     Comment:   External Document

## 2010-07-30 NOTE — Assessment & Plan Note (Signed)
Summary: EAR IRRIGATION/SLJ  Nurse Visit  Comments patient in today for bilateral ear irrigation. both ears cleared pt tolerated procedure well. Adella Hare LPN  February 05, 2010 11:05 AM     Allergies: No Known Drug Allergies  Orders Added: 1)  Cerumen Impaction Removal [[id number]]

## 2010-07-30 NOTE — Medication Information (Signed)
Summary: PA denial for dexilant  PA denial for dexilant   Imported By: Burnadette Peter LPN 01/65/8006 34:94:94  _____________________________________________________________________  External Attachment:    Type:   Image     Comment:   External Document

## 2010-08-01 NOTE — Progress Notes (Signed)
  Phone Note Call from Patient   Summary of Call: Pharmacy requesting for Citracal to be changed to cheaper Oscal 500. Ok to change? [facility] Initial call taken by: Everitt Amber LPN,  July 09, 2010 2:01 PM  Follow-up for Phone Call        ok to change i have entered this historically,pls notify pharmacy and pt and caregiver and send in new script Follow-up by: Syliva Overman MD,  July 10, 2010 4:29 AM  Additional Follow-up for Phone Call Additional follow up Details #1::        pharmacy sent msg to change to oscal instead of citracal due to cost savings. [facility] rx to [facility] Additional Follow-up by: Everitt Amber LPN,  July 10, 2010 11:34 AM    New/Updated Medications: OSCAL 500/200 D-3 500-200 MG-UNIT TABS (CALCIUM CARBONATE-VITAMIN D) Take 1 tablet by mouth three times a day Prescriptions: OSCAL 500/200 D-3 500-200 MG-UNIT TABS (CALCIUM CARBONATE-VITAMIN D) Take 1 tablet by mouth three times a day  #90 x 11   Entered by:   Everitt Amber LPN   Authorized by:   Syliva Overman MD   Signed by:   Everitt Amber LPN on 60/45/4098   Method used:   Printed then faxed to ...       [facility] (retail)       [address]       [place], [place]  [address]       Ph: [phone]       Fax: [phone]   RxID:   [id number] OSCAL 500/200 D-3 500-200 MG-UNIT TABS (CALCIUM CARBONATE-VITAMIN D) Take 1 tablet by mouth three times a day  #90 x 11   Entered and Authorized by:   Syliva Overman MD   Signed by:   Syliva Overman MD on 07/10/2010   Method used:   Historical   RxID:   [id number]   Appended Document: med change Pharmacy was wanting to change to oyster shell 500/400 not the oscal. I misunderstood.   Clinical Lists Changes  Medications: Changed medication from OSCAL 500/200 D-3 500-200 MG-UNIT TABS (CALCIUM CARBONATE-VITAMIN D) Take 1 tablet by mouth three times a day to OYSTER SHELL CALCIUM 500-400 MG-UNIT TABS (CALCIUM  CARB-CHOLECALCIFEROL) Take 1 tablet by mouth three times a day

## 2010-08-01 NOTE — Letter (Signed)
Summary: D/C MEDICINE  D/C MEDICINE   Imported By: Dierdre Harness 07/15/2010 14:58:57  _____________________________________________________________________  External Attachment:    Type:   Image     Comment:   External Document

## 2010-08-30 ENCOUNTER — Encounter: Payer: Self-pay | Admitting: Family Medicine

## 2010-09-05 NOTE — Letter (Signed)
Summary: fl2 paper  fl2 paper   Imported By: Lind Guest 08/30/2010 14:46:20  _____________________________________________________________________  External Attachment:    Type:   Image     Comment:   External Document

## 2010-09-05 NOTE — Miscellaneous (Signed)
Summary: Home Care Report  Home Care Report   Imported By: Dierdre Harness 08/30/2010 14:46:38  _____________________________________________________________________  External Attachment:    Type:   Image     Comment:   External Document

## 2010-11-04 ENCOUNTER — Telehealth: Payer: Self-pay

## 2010-11-04 ENCOUNTER — Encounter: Payer: Self-pay | Admitting: Family Medicine

## 2010-11-04 NOTE — Telephone Encounter (Signed)
Fax from [facility]. Benefiber caps unavailable. Pt been on one tablet twice daily. Is it OK to discontinue and do Metamucil caps one tablet twice daily.

## 2010-11-04 NOTE — Telephone Encounter (Signed)
Called and informed Tobi Bastos at [facility].

## 2010-11-04 NOTE — Telephone Encounter (Signed)
Sure--metamucil is fine.

## 2010-11-06 ENCOUNTER — Encounter: Payer: Self-pay | Admitting: Family Medicine

## 2010-11-06 ENCOUNTER — Ambulatory Visit: Payer: Self-pay | Admitting: Family Medicine

## 2010-11-12 NOTE — Op Note (Signed)
NAMECARRIE, USERY                 ACCOUNT NO.:  [id number]   MEDICAL RECORD NO.:  [id number]          PATIENT TYPE:  AMB   LOCATION:  DAY                           FACILITY:  [facility]   PHYSICIAN:  Caro Hight, M.D.      DATE OF BIRTH:  12/26/1960   DATE OF PROCEDURE:  DATE OF DISCHARGE:                               OPERATIVE REPORT   REFERRING PHYSICIAN:  Norwood Levo. Moshe Cipro, MD   PROCEDURE:  Colonoscopy.   INDICATION FOR EXAM:  Abigail Hopkins is a 50 year old female who presents with  abdominal pain and weight loss.  She has had an evaluation by CT scan  and upper endoscopy which did not reveal an etiology for her chronic  abdominal pain.   FINDINGS:  1. Normal terminal ileum approximately 20 cm visualized.  2. Normal colon without evidence of polyps, masses, inflammatory      changes, diverticula, or AVMs.  3. Small hemorrhoids.  Otherwise, normal retroflexed view of the      rectum.   DIAGNOSES:  No source for Abigail Hopkins's abdominal pain identified.  It is  possible that she has constipation.  The differential diagnosis also  includes small bowel bacterial overgrowth, IBS-constipation predominant,  or functional abdominal pain.   RECOMMENDATIONS:  1. Schedule a hydrogen breath test for small bowel bacterial      overgrowth.  2. Continue Colace twice a day.  Add Benefiber twice a day.  She      should follow a high-fiber diet.  She is given a handout on high-      fiber diet and hemorrhoids.  3. Followup appointment in 2 months with Dr. Stann Mainland regarding her      abdominal pain and to recheck her weight.   MEDICATIONS:  1. Demerol 100 mg IV.  2. Versed 5 mg IV.  3. Phenergan 12.5 mg IV.   PROCEDURE TECHNIQUE:  Physical exam was performed.  Informed consent was  obtained from the patient after explaining the benefits, risks, and  alternatives to the procedure.  The patient was connected to the monitor  and placed in the left lateral position.  Continuous oxygen was provided  by  nasal cannula.  IV medicine administered through an indwelling  cannula.  After administration of sedation and rectal exam, the  patient's rectum was intubated,  and the scope was advanced under direct visualization to the cecum.  The  scope was removed slowly by carefully examining the color, texture,  anatomy, and integrity of the mucosa on the way out.  The patient was  recovered in endoscopy and discharged home in satisfactory condition.      Caro Hight, M.D.  Electronically Signed     SM/MEDQ  D:  07/10/2008  T:  07/10/2008  Job:  [id number]   cc:   Bonne Dolores, M.D.  Fax: [phone]

## 2010-11-12 NOTE — Op Note (Signed)
NAMEBEVA, Abigail Hopkins                 ACCOUNT NO.:  [id number]   MEDICAL RECORD NO.:  [id number]          PATIENT TYPE:  AMB   LOCATION:  DAY                           FACILITY:  [facility]   PHYSICIAN:  Caro Hight, M.D.      DATE OF BIRTH:  Nov 10, 1960   DATE OF PROCEDURE:  11/23/2007  DATE OF DISCHARGE:                               OPERATIVE REPORT   PROCEDURE:  Esophagogastroduodenoscopy with cold forceps biopsy.   INDICATION FOR EXAM:  Abigail Hopkins is a 50 year old female who presents with  complaint of intermittent abdominal pain.  She had a CT scan, which  suggested a lesion in her fundus.   FINDINGS:  1. Normal esophagus without evidence of Barrett mass, erosion,      ulceration, or stricture.  2. Streaky erythema in the body and the antrum with occasional      erosion.  Biopsy was obtained via cold forceps to evaluate for H.      pylori gastritis.  No lesion seen in the fundus.  3. Normal duodenal bulb and second portion of the duodenum.   DIAGNOSIS:  Gastritis.   RECOMMENDATIONS:  1. We will call Abigail Hopkins with results of her biopsy.  2. No aspirin, NSAIDs, or anticoagulation for 5 days.  3. She should resume her previous diet, but avoid gastric irritants.  4. She is given a handout on gastric irritants and gastritis.  5. Followup appointment in 8 weeks with Vickey Huger in regards to      her abdominal pain.   MEDICATIONS:  1. Demerol 100 mg IV.  2. Versed 6 mg IV.  3. Phenergan 12.5 mg IV.   PROCEDURE TECHNIQUE:  Physical exam was performed.  Informed consent was  obtained from the patient after explaining the benefits, risks, and  alternatives to the procedure.  The patient was connected to the monitor  and placed in the left lateral position.  Continuous oxygen was provided  by nasal cannula and IV medicine administered through an indwelling  cannula.  After administration of sedation, the patient's esophagus was  intubated.  The scope was advanced under direct  visualization to the  second portion of the duodenum.  The scope was removed slowly by  carefully examining the color, texture, anatomy, and integrity of the  mucosa on the way out.  The patient was recovered in endoscopy and  discharged home in a satisfactory condition.   PATH:  Gastritis. Continue Kapidex.      Caro Hight, M.D.  Electronically Signed     SM/MEDQ  D:  11/23/2007  T:  11/24/2007  Job:  [id number]   cc:   Norwood Levo. Moshe Cipro, M.D.  Fax: [phone]

## 2010-11-12 NOTE — Assessment & Plan Note (Signed)
NAME:  Abigail Hopkins, Abigail Hopkins                  CHART#:  [id number]   DATE:  11/16/2007                       DOB:  February 21, 1961   CHIEF COMPLAINT:  Abdominal pain.   PRIMARY CARE PHYSICIAN:  Abigail Hopkins, M.D.   SUBJECTIVE:  Abigail Hopkins is a 50 year old African American female who  presents today for follow-up.  She has been seen since April for  complaints of abdominal pain.  She apparently has a reported history of  chronic abdominal pain.  She is somewhat of a poor historian.  She does  have some mental deficits and psychotic disorder.  She states that she  is having abdominal pain today.  According to the caregiver, she has not  complained of any pain since we last saw her on October 18, 2007.  Patient, however, states she has pain almost every day.  She points to  her mid abdomen.  She denies any nausea or vomiting.  She states her  bowel movements are regular.  No blood in the stools or melena.  No  dysphagia or odynophagia.  Her heartburn is controlled on Kapidex.  She  does have a history of slightly elevated transaminases, which we have  been monitoring.  Please see October 18, 2007, for higher work-up.   CURRENT MEDICATIONS:  See updated list.   ALLERGIES:  No known drug allergies.   PHYSICAL EXAMINATION:  VITAL SIGNS:  Weight 169, up 3 pounds.  Temperature 98.4, blood pressure 138/90, pulse 72.  GENERAL APPEARANCE:  A pleasant, well-nourished, well-developed black  female in no acute distress.  SKIN:  Warm and dry, no jaundice.  HEENT:  Sclerae are nonicteric.  ABDOMEN:  Positive bowel sounds.  Abdomen is full.  She has moderate  epigastric right upper quadrant tenderness to deep palpation.  No  rebound or guarding.  No abdominal bruits or hernias.  No  hepatosplenomegaly or masses.  LOWER EXTREMITIES:  No edema.   IMPRESSION:  Abigail Hopkins is a pleasant 50 year old lady with history of  chronic intermittent abdominal pain.  Unfortunately, she is a somewhat  poor historian given her  history of psychotic disorder and mental  deficiencies.  According to the caregiver, she eats very well.  She does  not seem to be in any pain, however, patient states she is having  frequent abdominal pain.  Objectively, she has had some mildly elevated  transaminases in the most recent past, in yesterdays labs they had  improved with her ALT being minimally elevated at 47 from 55 and her AST  normal at 31 now.  Her other LFT parameters were normal as well.  At  this point, I feel we should proceed with CT to further evaluate ongoing  abdominal pain.  I have discussed this with patient and their caregiver  who is in agreement.   PLAN:  1. CT of the abdomen and pelvis with IV normal contrast.  2. Continue Kapidex as before.  3. Further recommendations to follow.   ADDENDUM:  CT Scan: ? filling defect in fundus--> EGD.       Neil Crouch, P.A.  Electronically Signed     Caro Hight, M.D.  Electronically Signed    LL/MEDQ  D:  11/16/2007  T:  11/16/2007  Job:  [id number]   cc:   Abigail Hopkins, M.D.

## 2010-11-12 NOTE — Op Note (Signed)
NAMEMALAYA, Abigail Hopkins                 ACCOUNT NO.:  [id number]   MEDICAL RECORD NO.:  [id number]          PATIENT TYPE:  AMB   LOCATION:  DAY                           FACILITY:  [facility]   PHYSICIAN:  Caro Hight, M.D.      DATE OF BIRTH:  19-Oct-1960   DATE OF PROCEDURE:  07/12/2008  DATE OF DISCHARGE:  07/11/2008                               OPERATIVE REPORT   PROCEDURE:  Hydrogen breath test.   REFERRING PHYSICIAN:  Tula Nakayama.   INDICATIONS FOR EXAMINATION:  Abigail Hopkins is a 50 year old female who  complains of chronic abdominal pain and has unintentional weight loss.  She does have constipation.  The abdominal pain that she has is in the  right upper quadrant and it follows with meals.  She has had a complaint  of chronic abdominal pain since 2008.  In April 2009, she was having  diarrhea.  The hydrogen breath test is being performed to evaluate the  small bowel bacterial overgrowth as an etiology for her postprandial  abdominal pain.   PREPROCEDURE CHECK:  She has had no fiber bread or beans on July 10, 2008.  She has no diarrhea.  She has had nothing to eat or drink after  5:00 p.m. on July 10, 2008.   TEST SUGAR:  Lactulose 10 g/15 mL (37.5 mL ingested).   RESULTS:  The Breathalyzer initially measured 6 parts per million.  At  9:00 a.m., the Breathalyzer read 20 parts per million.  At 9:15, 43  parts per million, and a 9:30, 51 parts per million, and at 9:45, 42  parts per million.   IMPRESSION:  Hydrogen level consistent with small bowel bacterial  overgrowth.   RECOMMENDATIONS:  Will add Augmentin 500 mg b.i.d. for 7 days. The side  effects of the medications were discussed and include red rash, loose  stools, belly pain.  I also called in Benefiber 1 scoop or 1 dose b.i.d.  She has a follow up appointment to see me in March 2010.      Caro Hight, M.D.  Electronically Signed     SM/MEDQ  D:  07/12/2008  T:  07/13/2008  Job:  [id number]   cc:   Norwood Levo. Moshe Cipro, M.D.  Fax: [phone]

## 2010-11-12 NOTE — Consult Note (Signed)
Abigail Hopkins, Abigail Hopkins                 ACCOUNT NO.:  [id number]   MEDICAL RECORD NO.:  [id number]           PATIENT TYPE:  AMB   LOCATION:  DAY                           FACILITY:  [facility]   PHYSICIAN:  Caro Hight, M.D.      DATE OF BIRTH:  July 10, 1960   DATE OF CONSULTATION:  DATE OF DISCHARGE:                                 CONSULTATION   REFERRING PHYSICIAN Genora Arp:  Norwood Levo. Moshe Cipro, MD   PROBLEM LIST:  1. Chronic abdominal pain.  2. Hypertension.  3. Anxiety.  4. Psychotic disorder.  5. Hysterectomy with bilateral oophorectomy.  6. Cholecystectomy.   SUBJECTIVE:  Abigail Hopkins is a 51 year old female who has been seen since  2008 for chronic abdominal pain.  In January 2008, she had a CT scan of  the abdomen and pelvis with contrast which showed diffuse fatty liver  disease and a left adnexal cyst.  The ovary was removed in February  2008.  In April 2009, she presented to our office with daily abdominal  pain.  Lab evaluation revealed elevated liver enzymes and hepatitis B  surface antigen and hepatitis C antibody.  Serologies were negative.  She also had a normal thyroid, hemoglobin, and her ALT was noted to be  55, and AST was 38.  Her alk phos was 86 and her albumin was 4.8.  Her  lipase was 82, which is not clinically significant since it is less than  2 times the upper limit of normal.  She also has stool studies for O&P  and routine culture which were negative.  She had 4 hemoccults, which  were negative in April 2009.  She was seen in followup and she had  improvement in her epigastric pain and nausea with Kapidex.  Her  diarrhea has resolved.  It was felt that her liver enzyme elevation was  due to fatty liver disease.  At that time, her body mass index was 30.9  (obese).   Her liver enzymes were premeasured in April 2009 and her ALT was 47 and  her AST was 31 with a total bili 0.5 and an albumin of 4.6.  Because of  her continued complaint of right upper quadrant pain  and mildly elevated  liver enzymes, a CT scan of the abdomen and pelvis was performed.  It  again showed diffuse fatty liver disease.  There was a questionable  filling defect in the stomach, and increased stool in the rectosigmoid  region.  The increased stool was concerning for constipation.  Upper  endoscopy was performed in May 2009, which showed chronic mildly active  gastritis without evidence of H. pylori.  The patient was not seen again  until August 2009, and she was doing well and had no complaints of  nausea, vomiting, or abdominal pain.  She was scheduled a 10-month followup.  She presents for her 441-monthollowup on today.  She had a  liver enzymes in November 2009, which showed an AST of 27 and ALT of 36  (0 - 35).  She complains of achy abdominal pain primarily in  the  morning.  She says it may last minutes.  Her ability to provide a  history is limited due to her underlying cognitive impairment.  She says  her stomach hurts in the mid all the abdomen.  She has been eating okay.  Her weight has decreased 10 pounds since September 2009.  The weight  loss, according to her, was unintentional.  She does not feel hungry.  She denies any constipation or diarrhea.  She really cannot identify any  triggers for her abdominal pain.  The pain has been there for over 2  months.  She denies any blood in her stool or black tarry stools.  Pepto-  Bismol in the morning helps.  She does consume cheese and eggs, and milk  sometimes.  She rarely consumes ice cream.  She has burping.  She denies  any nausea, vomiting, or problems of swallowing.  She has a bowel  movement possibly every couple of days.  She is not taking any  aspirin, BC powder, or Goody powder.  She continues to take Kapidex once  a day.  Eating also makes her pain better.   FAMILY HISTORY:  She has no family history of ulcer, colitis, Crohn  disease, or colon cancer.   SOCIAL HISTORY:  She denies any tobacco or alcohol  use.   ALLERGIES:  No known drug allergies.   MEDICATIONS:  1. Clonazepam 0.5 mg b.i.d.  2. Os-Cal b.i.d.  3. Colace 100 mg b.i.d.  4. Zyprexa 7.5 mg nightly.  5. Estradiol 1 mg daily.  6. Kapidex 60 mg daily.  7. Topamax 100 mg daily.  8. Hydrochlorothiazide 12.5 mg daily.  9. Lisinopril 20 mg daily.   OBJECTIVE:  VITAL SIGNS:  Weight 159 pounds, height 5 feet 2 inches,  temperature 98.9, blood pressure 120/88, and pulse 72.  BMI 29.1  (overweight).GENERAL:  She is in no apparent distress, alert and  oriented x4.HEENT:  Atraumatic and normocephalic.  Pupils equal and  reactive to light.  Mouth, no oral lesions.  Posterior pharynx without  erythema or exudate.NECK:  Full range of motion.  No  lymphadenopathy.LUNGS:  Clear to auscultation  bilaterally.CARDIOVASCULAR:  Regular rhythm.  No murmur.  Normal S1 and  S2.ABDOMEN:  Bowel sounds are present, soft, mild tenderness to  palpation in all four quadrants without rebound or guarding.  EXTREMITIES:  No cyanosis or edema.NEUROLOGIC:  She has no focal  neurologic deficits.   ASSESSMENT:  Abigail Hopkins is a 50 year old female who complains of abdominal  pain and unintentional weight loss. Her most recent upper endoscopy and  CT scan were May in 2009.  She likely has some morning abdominal  discomfort due to constipation.  The differential diagnosis includes a  low likelihood of colon cancer.  She also is average risk for developing  colon cancer.   Thank you for allowing me to see Abigail Hopkins in consultation.  My  recommendations as follows.   RECOMMENDATIONS:  1. Will schedule colonoscopy in July 11, 2007, due to her abdominal      pain and weight loss.  She is to hold her HCTZ on the day prior to      the day of her procedure.  She will have a TriLyte bowel prep.  2. Will also check a hemoglobin, hematocrit, ferritin, and hepatic      function panel today.  3. She has a follow up appointment in March 2010.  Will rule out any  acute pathology and otherwise she likely has functional abdominal      pain.  4. She should continue the Kapidex.      Caro Hight, M.D.  Electronically Signed     SM/MEDQ  D:  07/06/2008  T:  07/07/2008  Job:  [id number]   cc:   Norwood Levo. Moshe Cipro, M.D.  Fax: [phone]

## 2010-11-12 NOTE — Assessment & Plan Note (Signed)
NAME:  Abigail Hopkins, Abigail Hopkins                  CHART#:  [id number]   DATE:  02/24/2008                       DOB:  [birth year]   REFERRING PHYSICIAN:  Norwood Levo. Moshe Cipro, MD.   PROBLEM LIST:  1. Abdominal pain.  2. Hypertension.  3. Anxiety.  4. Psychotic disorder.  5. Hysterectomy with right salpingo-oophorectomy followed by left      salpingo-oophorectomy.  6. Cholecystectomy.   SUBJECTIVE:  The patient is a 50 year old female who was last seen in  May 2009.  She had an EGD with cold forceps biopsy, which showed streaky  erythema in the antrum.  Biopsies showed chronic mildly active  gastritis.  She was asked to continue Kapidex.  She reports that she has  had no nausea, vomiting, or abdominal pain.  She came today to get a  refill.  She is accompanied by a family member.   MEDICATIONS:  1. Clonazepam.  2. Os-Cal.  3. Colace.  4. Topamax.  5. Zyprexa.  6. Diovan/HCTZ.  7. Estradiol.  8. Kapidex 60 mg daily.  9. Topamax 100 mg daily.   OBJECTIVE:  Physical exam: VITAL SIGNS:  Weight 169 pounds (up 3 pounds  since April 2009), height 5 feet 2 inches, BMI 32.6(obese), temperature  98.5, blood pressure 122/80, and pulse 76.GENERAL:  She is in no  apparent distress.  Alert and interactive.LUNGS:  Clear to auscultation  bilaterally.CARDIOVASCULAR:  Regular rhythm.  No murmur.  ABDOMEN:  Bowel sounds are present, soft, nontender, and nondistended.   ASSESSMENT:  The patient is a 50 year old female who has gastritis and  possibly gastroesophageal reflux disease and her symptoms are now  resolved.  She does have some impaired ability to communicate.  She also  was noted to have mildly elevated liver enzymes in April 2009 with an  AST of 38 and ALT of 55.  Her repeat liver enzymes in May 2009 showed an  ALT of 47 and AST of 31.  She had a CT scan of the abdomen and pelvis  with IV contrast, which showed fatty liver disease.  Thank you for  allowing me to see the patient in consultation.   My recommendations  follow.   RECOMMENDATIONS:  1. She is given refills on Kapidex for up to a year.  2. I did discuss with her and her family member that she should lose      10 pounds.  I did discuss with her the risk of having cirrhosis      from fatty liver disease and how it relates to being obese.  If she      loses 10 pounds, her body mass index will be 29.1 consistent with      being overweight, which has health benefit associated with it      versus being obese.  3. She is given a handout on low-fat diet.  4. She has a return patient visit to see me in 4 months.  Will      reassess her weight and check her liver enzymes.       Caro Hight, M.D.  Electronically Signed     SM/MEDQ  D:  02/25/2008  T:  02/26/2008  Job:  [id number]   cc:   Norwood Levo. Moshe Cipro, M.D.

## 2010-11-12 NOTE — Assessment & Plan Note (Signed)
NAME:  Abigail Hopkins, Abigail Hopkins                  CHART#:  [id number]   DATE:  09/29/2007                       DOB:  [birth year]   REASON FOR CONSULTATION:  Abdominal pain.   PHYSICIAN REQUESTING CONSULTATION:  Tula Nakayama, M.D.   HISTORY OF PRESENT ILLNESS:  Abigail Hopkins is a 50 year old African-American  female who presents today for further evaluation of abdominal pain.  She  states she is having abdominal pain on a daily basis.  It has been  occurring for at least 1 month.  She saw Dr. Moshe Cipro for this about a  month ago, according to go to the caregiver.  She has had some nausea  associated with it.  She states she has intermittent abdominal pain.  She describes it as a dull ache.  It is in the epigastrium.  She really  has not noticed any modifying or alleviating factors.  She says her  appetite is good.  She has not had any weight loss.  She does have some  nausea which she is taking Phenergan for.  She denies any dysphagia,  odynophagia or heartburn.  She says her bowel movements have been more  frequent than usual.  She is having about 3 or 4 small soft or loose  stools daily.  Denies any blood in the stool or melena.  No recent ill  contacts.  No recent antibiotics per patient and caregiver.  No recent  changes in her medication, except for the addition of Phenergan.   CURRENT MEDICATIONS:  1. Clonazepam 0.5 mg b.i.d.  2. Os-Cal 500 +D t.i.d.  3. Colace 100 mg b.i.d.  4. Topamax 100 mg b.i.d.  5. Zyprexa 7.5 mg q. at bedtime.  6. Diovan HCT 160/12.5 mg daily.  7. Omeprazole 20 mg 2 daily.  8. Estradiol 1 mg daily.  9. Phenergan 12.5 mg p.r.n.   ALLERGIES:  No known drug allergies.   PAST MEDICAL HISTORY:  1. Hypertension.  2. Anxiety.  3. Chronic abdominal pain.  4. Psychotic disorder.  5. Prior hysterectomy status post right salpingo-oophorectomy.  6. She had a left salpingo-oophorectomy February 0076 for complicated      Left ovarian cyst.   FAMILY HISTORY:  Mother had  diabetes mellitus.  Father died with stroke.  She has 3 brothers with diabetes.   SOCIAL HISTORY:  She is widowed.  Previous smoker.  No alcohol use.  She  lives at a group home.  She does not work.   REVIEW OF SYSTEMS:  See history of present illness for GASTROINTESTINAL.  CONSTITUTIONAL:  Denies weight loss.  CARDIOPULMONARY:  Denies chest  pain, shortness of breath, palpitations, cough.  GENITOURINARY:  Denies  any dysuria, hematuria, or vaginal discharge.   PHYSICAL EXAMINATION:  VITAL SIGNS:  Weight 166.  Height 5 feet 2  inches.  Temperature 98.  Blood pressure 120/82.  Pulse 72.  GENERAL:  Pleasant well-nourished, well-developed black female in no acute  distress.  She answers most questions with just few words.  Does not  elaborate on any of her symptoms.  She is alert and oriented.  SKIN:  Warm and dry.  No jaundice.HEENT:  Sclerae nonicteric.  Oropharyngeal  mucosa moist and pink.  No lesions, erythema or exudate.  No  lymphadenopathy, thyromegaly.  CHEST:  Sounds clear to auscultation. CARDIAC:  Reveals regular rate  and  rhythm.  Normal S1, S2.  No murmurs, rubs, or gallops. ABDOMEN:  Positive bowel sounds.  Abdomen soft, nondistended.  She has moderate  tenderness in the epigastrium to deep palpation.  No rebound or  guarding.  No organomegaly or masses.  No abdominal bruits or hernias.  LOWER EXTREMITIES:  No edema.   IMPRESSION:  Abigail Hopkins is a 50 year old lady who presents with complaints  of abdominal pain for at least 1 month's duration.  She does have a  history of chronic abdominal pain but I really cannot gather any further  details regarding that.  We have not received any records from her  primary care physician's office.  Patient does states that she has  abdominal pain for about a month, which is mostly epigastric associated  with nausea.  She is on omeprazole 40 mg daily.  This may be  nonulcerative dyspepsia, gastroesophageal reflux disease, peptic ulcer   disease.  She also complains of a change in stools with increased  frequency and somewhat looser stools and describes some abdominal  discomfort associated with this.  This has been occurring for a few  weeks.  I do not get a history of recent antibiotic use.  We need to  rule out  infectious etiology.   PLAN:  1. Stools for O+P, culture, C. diff and WBCs as well as Hemoccult      times 3.  2. CBC, CMET, lipase, TSH.  3. Stop omeprazole.  Begin Kapidex 60 mg daily #10 samples provided      and prescription for #31 with 3 refills.  4. Retrieve records from Dr. Griffin Dakin office.  5. Office visit in 2 weeks.  If she is not having any significant      improvement of symptoms, she will need to have upper endoscopy for      further evaluation of epigastric pain and nausea.  She may      ultimately need to have a CT depending on how she responds to      therapy.   I would like to thank Dr. Tula Nakayama for allowing [place] to take part  in the care of this patient.       Neil Crouch, P.A.  Electronically Signed     Caro Hight, M.D.  Electronically Signed    LL/MEDQ  D:  09/29/2007  T:  09/29/2007  Job:  [id number]   cc:   Norwood Levo. Moshe Cipro, M.D.

## 2010-11-12 NOTE — Assessment & Plan Note (Signed)
NAME:  Abigail Hopkins                  CHART#:  [id number]   DATE:  10/18/2007                       DOB:  05/22/1961   CHIEF COMPLAINT:  Abdominal pain, nausea.   SUBJECTIVE:  Abigail Hopkins is here for follow-up visit.  She was last seen on  September 29, 2007.  She presented with a one month history of epigastric  pain and nausea.  She had also noted an increased frequency of stools.  We switched her omeprazole to Kapidex.  Stools were negative for O&P, C.  difficile, culture.  Lactoferrin was positive.  Stool was heme negative  x3.  Her TSH was normal.  Her CBC revealed a normal white count and  hemoglobin.  Her lymphocytes were up slightly to 57%.  Her sodium was  normal.  Potassium slightly low at 3.4.  Transaminases were slightly  elevated with AST of 38, ALT 55, albumin 4.8, lipase slightly elevated  at 82.  TSH was normal.  Her baseline LFT's on August 25, 2006  revealed AST 37, ALT 39.  Her hepatitis B and C markers were negative.  Not mentioned in the prior note she is status post cholecystectomy based  on CT scan January 2008, but we were not given this information  previously.  She did have diffuse fatty liver on that study.   The patient presents today stating that she has been feeling better.  The last week she has had no abdominal pain.  Her nausea has resolved.  She is not using any Phenergan.  Her appetite is good.  Her stools are  back to normal.  She has no complaints.  Her care Abigail Hopkins is with her  today and concurs with the above.   CURRENT MEDICATIONS:  See updated list.   ALLERGIES:  No known drug allergies.   PHYSICAL EXAMINATION:  VITAL SIGNS:  Weight 166 stable, temp 98.3, blood  pressure 124/76, pulse 96.  GENERAL:  Pleasant, well-nourished, black female in no acute distress.  SKIN:  Warm and dry.  No jaundice.  CHEST:  Lungs clear to auscultation.  CARDIAC:  Reveals regular rate and rhythm.  Normal S1, S2, no murmurs,  rubs or gallops.  ABDOMEN:  Positive bowel  sounds.  Abdomen is soft but full, nontender.  No organomegaly or masses.  No rebound or guarding.  No abdominal bruits  or hernias.  EXTREMITIES:  Lower extremities:  No edema.   IMPRESSION:  Abigail Hopkins is a 60-year lady who gives a 58-monthhistory of  epigastric pain, nausea.  She has improved dramatically with Kapidex.  Symptoms may have been secondary to gastroesophageal reflux disease,  gastritis, dyspepsia.  Her diarrhea is resolved.  She does have  minimally elevated transaminases which most likely due to fatty liver.  We will recheck this at a later date, and pursue further workup as  needed.  Given that she is no longer having any discomfort would hold  off further evaluation at this time, but will keep a close eye on her  and bring her back in four weeks for a progress report and recheck liver  function tests.   PLAN:  Recheck LFT's in four weeks.  Office visit in four weeks.       LNeil Crouch P.A.  Electronically Signed     SCaro Hight M.D.  Electronically Signed  LL/MEDQ  D:  10/18/2007  T:  10/18/2007  Job:  [id number]   cc:   Norwood Levo. Moshe Cipro, M.D.

## 2010-11-15 NOTE — Discharge Summary (Signed)
NAMEDONNAMAE, MUILENBURG                 ACCOUNT NO.:  [id number]   MEDICAL RECORD NO.:  [id number]          PATIENT TYPE:  INP   LOCATION:  K820                          FACILITY:  [facility]   PHYSICIAN:  Jonnie Kind, M.D. DATE OF BIRTH:  December 27, 1960   DATE OF ADMISSION:  08/27/2006  DATE OF DISCHARGE:  03/01/2008LH                               DISCHARGE SUMMARY   ADMISSION DIAGNOSIS:  Symptomatic left ovarian cyst.   DISCHARGE DIAGNOSIS:  Symptomatic left ovarian cyst.   PROCEDURE:  Left salpingo-oophorectomy.   DISCHARGE MEDICATIONS:  1. Estradiol 1 mg p.o. daily.  2. Hydrocodone/APAP 5/500 mg one to two q.4h. p.r.n. pain.   Prior medicines:  1. Clonazepam 0.5 mg p.o. b.i.d.  2. Diovan/HCT 160/12.5 mg p.o. daily.  3. Folic acid one p.o. daily.  4. Omeprazole 20 mg p.o. daily.  5. Calcium 500 mg p.o. b.i.d.  6. Colace 100 mg p.o. b.i.d.  7. Topamax 100 mg p.o. daily.  8. Zyprexa 7.5 mg p.o. q.h.s.   HOSPITAL SUMMARY:  This 50 year old female was admitted for laparotomy  due to symptomatic pain from a persistent left ovarian cyst followed for  over 6 weeks as outpatient.  See HPI.   HOSPITAL COURSE:  The patient underwent a laparotomy and left salpingo-  oophorectomy the day of admission through a small low abdominal  Pfannenstiel incision.  She had a minimal blood loss with hemoglobin  14.4 preop, 12.7 postop, with uneventful postop course and discharge  home on postop day #2, August 29, 2006.  Follow-up one week, staple  removal.      Jonnie Kind, M.D.  Electronically Signed     JVF/MEDQ  D:  08/29/2006  T:  08/29/2006  Job:  [id number]

## 2010-11-15 NOTE — H&P (Signed)
NAME:  Abigail Hopkins, Abigail Hopkins                 ACCOUNT NO.:  [id number]   MEDICAL RECORD NO.:  [id number]          PATIENT TYPE:  AMB   LOCATION:  DAY                           FACILITY:  [facility]   PHYSICIAN:  Jonnie Kind, M.D. DATE OF BIRTH:  1960/08/03   DATE OF ADMISSION:  DATE OF DISCHARGE:  LH                              HISTORY & PHYSICAL   ADMISSION DIAGNOSIS:  Symptomatic left ovarian cyst.   HISTORY OF PRESENT ILLNESS:  This 50 year old female, status post  hysterectomy who is seen in the office in follow-up of emergency room  visit at [facility] where she was seen for pelvic discomfort on  July 07, 2006.  Exam revealed that she had a large fullness in the  left lower quadrant which on ultrasound revealed a simple cyst that had  a couple small thin, septations felt to represent either an ovarian  remnant or hydrosalpinx.  A CA-125 has been obtained and is normal.  Repeat ultrasound in the office on August 05, 2006 shows no change in  the cyst which is now 9.9 x 9.4 cm, simple cyst and uncomfortable due to  pressure effect.  The patient is admitted for surgical removal.  Plans  are for removal of both tubes and ovaries.  Kavita has been seen in our  office with her legal guardian.  We have made extensive efforts on all  three visits to the office to insure that Wendy understood what was  going on and wanted surgery.   PAST MEDICAL HISTORY:  1. Positive reflux.  2. Hypertension.  3. Mental retardation.   PAST SURGICAL HISTORY:  1. Cholecystectomy.  2. Hysterectomy.   SOCIAL HISTORY:  Nonsmoker, nondrinker.  Lives in a group home.   MEDICATIONS:  1. Klonopin 0.5 mg p.o. daily.  2. Diovan/HCTZ 160/12.5 one p.o. daily.  3. Folic acid 1 mg daily.  4. Calcium 500 mg 1 daily.  5. Prilosec oral 40 mg q.a.m.  6. Colace oral 100 mg twice daily.  7. Topamax oral 100 mg at bedtime.  8. Zyprexa oral 7.5 mg at bedtime.  9. Vicodin oral 5/500 p.r.n. pain.   GENERAL  EXAM:  She is a somber African American female alert and  oriented x3.  Weight 158.8 pounds.  Blood pressure 120/80.  Pupils  equal, round and reactive.  Neck supple.  CHEST:  Clear to auscultation.  BREASTS:  Deferred.  CARDIAC EXAM:  A 2/6 systolic ejection murmur at both upper sternal  borders.  No rubs or gallops noted.  LUNGS:  Clear to auscultation.  EXTREMITIES:  Entirely normal.  VAGINAL EXAM:  Normal vagina.  Cervix absent.  Adnexa with cystic  structure on the left adnexa on ultrasound and palpable in the left  lower quadrant.   IMPRESSION:  Symptomatic simple cyst left ovary unresponsive to  continued observation.   PLAN:  Laparotomy and bilateral salpingo-oophorectomy August 27, 2006  at 8:30 a.m.      Jonnie Kind, M.D.  Electronically Signed     JVF/MEDQ  D:  08/26/2006  T:  08/26/2006  Job:  [id number]   cc:   [facility]   Norwood Levo. Moshe Cipro, M.D.  Fax: [phone]

## 2010-11-15 NOTE — H&P (Signed)
NAMEFELIX, Abigail Hopkins                 ACCOUNT NO.:  [id number]   MEDICAL RECORD NO.:  [id number]          PATIENT TYPE:  INP   LOCATION:  IC06                          FACILITY:  [facility]   PHYSICIAN:  Bonnielee Haff, MD     DATE OF BIRTH:  21-Aug-1960   DATE OF ADMISSION:  11/18/2004  DATE OF DISCHARGE:  LH                                HISTORY & PHYSICAL   PRIMARY MEDICAL DOCTOR:  Norwood Levo. Moshe Cipro, M.D.   PSYCHIATRIST:  Dr. Wonda Amis   The patient does not have a neurologist.   ADMITTING DIAGNOSES:  1.  Dilantin toxicity.  2.  Schizophrenia.  3.  Seizure disorder.  4.  Hypertension.   CHIEF COMPLAINT:  Gait instability and high Dilantin level.   HISTORY OF PRESENT ILLNESS:  The patient is a 50 year old African-American  female who lives in a family rest home and who has a history of  schizophrenia, seizure disorder, and hypertension.  The patient is a very  poor historian, unable to give any details.  The person who supervises the  patient in the family rest home is also in the patient's room at this time;  however, she is also not able to give a very accurate history.  However,  based on information from Dr. Moshe Cipro and based on the history, the patient  has been having gait disturbances and as a result frequent falls for the  past 3-4 months.  The patient denies any syncopal episodes with these falls.  She denies any headache, chest pain, palpitations, or shortness of breath.  She denies any visual symptoms.  The patient also denies any seizure  episodes with these falls.   According to the person with the patient, the patient used to be able to  walk without any difficulty at the beginning of this year, but she has been  seen to be declining with her level of activity since about February or  March.  The patient did have a visit to the ER on April 13 with frequent  falls and altered mental status.  It was thought that this was likely  secondary to her medications and dose  adjustments were made to the same.  The patient does not give a history of falling to any particular side or to  the front or the back. She does give a history of vertiginous symptoms with  these episodes.  She denies any hearing loss.  The patient underwent an MRI  of her brain, for similar episodes back in February of 2006.  An MRI showed  minimal white matter abnormality in both cerebral hemispheres and they said  that this could be early changes of multiple sclerosis. The patient then had  a CAT scan of her head when she came to the ER which also did not show any  acute intracranial abnormality.   The patient went to her primary doctor, Dr. Moshe Cipro, today where blood work  was drawn and apparently her Dilantin level was 46.6, at which point it was  decided to admit the patient.   MEDICATIONS:  The patient is on  the following:  1.  Dilantin 200 mg at bedtime.  2.  Topamax 100 mg daily.  3.  Potassium chloride 20 mEq twice daily.  4.  Geodon 80 mg at bedtime.  5.  Fluvoxamine 150 mg by mouth at bedtime.  6.  Clonazepam 0.4 mg q.i.d.  7.  Diovan/HCT 80/12.5 one tablet by mouth daily.  8.  Zyprexa 7.5 mg at bedtime.  9.  Prilosec 40 mg daily,  10. Levbid 1 tablet twice daily.  11. Flagyl 500 mg 1 tablet by mouth, Monday-Wednesday-Friday.   It is not very clear if any of these medications are new.  This information  is not easily obtained based on the list available to me. It is also not  clear if any of these medications were started when all of her symptoms  began.   ALLERGIES:  No known drug allergies.   PAST MEDICAL HISTORY:  1.  Hypertension.  2.  Seizure disorder for the past many years, but apparently the patient has      not had a seizure since 1998.  3.  She has had a cholecystectomy.  4.  Hysterectomy in the past.   SOCIAL HISTORY:  The patient lives in the [place]. She quit  smoking cigarettes 3 years ago.  It is unknown how long she smoked for.   There is no history of alcohol use.  No history of any illicit drug use.   FAMILY HISTORY:  Father died of a stroke at an unknown age.  Mother has  diabetes.  She does not know if any of her siblings have any medical  problems.  She does not have any children.   REVIEW OF SYSTEMS:  A 10-point review of systems was done which elicited  some difficulty with urination and some dysuria, but no hematuria.  There  was no history of any diarrhea or constipation.   PHYSICAL EXAMINATION:  VITAL SIGNS:  The patient is afebrile.  Blood  pressure is 134/90; heart rate is about 80 beats/minute and regular,  respiratory rate is 16; pulse oximetry 95-100% on room air.  GENERAL EXAM:  A well-developed, well-nourished, young patient in no acute  distress.  HEENT:  There is no pallor, no icterus.  Oral mucous membranes appear moist.  No oral lesions are seen.  NECK:  Soft and supple.  LUNGS:  Clear to auscultation bilaterally.  CARDIOVASCULAR:  S1-S2 is normal, regular.  There is a systolic murmur  appreciated in the mitral area.  NECK:  No carotid bruits appreciated. No JVD is seen.  ABDOMEN:  Soft, nontender, nondistended.  No mass or organomegaly is  present.  No tenderness is present.  Bowel sounds are heard.  EXTREMITIES:  Without edema.  Peripheral pulses are palpable.   NEUROLOGIC EXAM:  The patient is alert though not oriented to time and only  very poorly to place.  Cranial nerves examination reveals lateral gaze  nystagmus, mostly with left lateral gaze.  Again, examination was limited  secondary to patient cooperation.  None of her other cranial nerves appeared  to be abnormal.  Pupils are equal and reactive.  Sensory exam was within normal limits.  Motor exam:  The patient had 5/5 strength in both upper and lower  extremities.  Reflexes were equal.  Cerebellar signs, again, very limited but seemed intact. Gait:  The patient has very unsteady gait and she was imbalanced and tended  to  fall to no particular side.  She could not  do the Romberg test.   LABORATORY DATA:  Based on Dr. Griffin Dakin information her Dilantin level was  46.6 when done on an outpatient basis.  Her Chem-7 was apparently normal.  All inpatient labwork is pending at this time.   IMPRESSION:  This is a 50 year old African-American female with a history of  schizophrenia, seizure disorder, and hypertension who lives in a family rest  home.  She presents with a few months' history of ataxia and multiple  episodes of fall.  In the outpatient clinic she was found to have a high  Dilantin level which could cause all of her symptoms.  The reason for her  high Dilantin level could be interaction with any of these medications, for  example, Fluvoxamine, Geodon, Topamax which can cause increase in the  therapeutic level of dilantin.   The patient has had a CAT scan of her head as well as an MRI which did not  reveal any clear cut etiology for her symptoms.  MRI, as mentioned, that the  changes found in the white matter could represent early changes of multiple  sclerosis. I do not believe that there is a central nervous system lesion  which is causing any of these symptoms, especially considering normal  imaging studies done within the past 1-1/2 months.   PLAN:  1.  Dilantin toxicity:  Obviously we will hold the patient's dilantin during      her hospital stay.  We will monitor her Dilantin level closely.  We will      monitor the patient on telemetry.  We will obtain an EKG level.  We will      draw all of her labs including CBC, hepatic function tests, coagulation      profile, TSH, and a Dilantin level.  We will involve physical therapy to      see the patient once her Dilantin levels appear to be decreasing.   I will put the patient on deep venous thrombosis prophylaxis.  I am holding  all of her psychiatric medications at this time.  Once that happens her  medications will be slowly reinstituted.  If  need be, a neurological  consultation will be obtained.   Further management decisions will be based on results of the initial testing  and the patient's response to treatment.      GK/MEDQ  D:  11/18/2004  T:  11/18/2004  Job:  [id number]   cc:   Norwood Levo. Moshe Cipro, M.D.  [address]  [place], [place] [address]  Fax: [phone]

## 2010-11-15 NOTE — Op Note (Signed)
Abigail Hopkins, Abigail Hopkins                 ACCOUNT NO.:  [id number]   MEDICAL RECORD NO.:  [id number]          PATIENT TYPE:  INP   LOCATION:  A417                          FACILITY:  [facility]   PHYSICIAN:  Jonnie Kind, M.D. DATE OF BIRTH:  [birth year]   DATE OF PROCEDURE:  DATE OF DISCHARGE:                               OPERATIVE REPORT   PREOPERATIVE DIAGNOSIS:  Symptomatic left ovarian cyst.   POSTOPERATIVE DIAGNOSIS:  Symptomatic left ovarian cyst.   PROCEDURE:  Left salpingo-oophorectomy.   SURGEON:  Jonnie Kind, M.D.   ASSISTANTCandis Schatz, R.N.   ANESTHESIA:  General.   COMPLICATIONS:  None.   FINDINGS:  Evidence of prior right salpingo-oophorectomy and  hysterectomy, evidence of prior appendectomy, small omental adhesions to  the anterior abdominal wall, significantly smaller ovarian cysts noted  at both previous ultrasounds, indicating functional cyst that was  resolving finally.   DESCRIPTION OF PROCEDURE:  The patient was taken to the operating room  and prepped and draped for lower abdominal surgery.   A Pfannenstiel incision was repeated.  The old cicatrix was removed in  about a 1-cm wide strip.  The fascia was opened transversely in standard  fashion, removing some old Prolene suture from the fascia.  The fascia  was lifted off of the rectus muscles, and with some technical challenge,  we were able to carefully enter the peritoneum in the midline, being  careful to use traction, countertraction, and there was no evidence of  injury to the bowel.  The omentum was stuck to the anterior abdominal  wall and to the upper portions of the prior laparotomy incision site and  required transection of the adhesions so that visualization of the  abdomen was adequate.  The bowel could be elevated up, and a flexible  retractor was placed to hold the incision open.  The elastic wound  retractor was in place and the bowel packed away with three moistened  laparotomy  tapes.   Attention was directed to the left ovary.  The ovary was somewhat  smaller than at last ultrasound just two weeks ago.  Nevertheless,  bilateral salpingo-oophorectomy was performed.  The adhesions of the  ovary to the pelvic sidewall were first addressed with sharp  transection.  The ureter could be visualized well out of the way of the  area of surgical endeavor.  The patient then had the infundibulopelvic  ligament isolated by entering the retroperitoneum laterally using  spreading technique to identify both the IP ligament and then the  ureter.  With the ureter well out of the way, the IP ligament could be  clamped, cut, and suture ligated.  The remaining portion of the  attachments to the pelvic sidewall could be sharply dissected off such  that a small remnant of attachment to the sidewall was crossclamped with  a Kelly clamp, and the tube and ovary were removed.  Both pedicles had  been tied with 2-0 chromic.  Hemostasis was excellent.  The pelvis was  further inspected, and it was identified that on the right side the tube  and  ovary had previously been removed.  The ileocecal junction was  inspected, and the appendix appeared removed as well.  The patient was  therefore inspected, and inspection of the upper abdomen revealed no  identifiable pathology.  We then proceeded with removal of the  laparotomy equipment, closure of the peritoneum first with 2-0 chromic,  the fascia with 0 Vicryl, the subcutaneous tissues with 2-0 plain, and  staple closure of the skin.  Sponge and needle counts were correct.   The patient tolerated the procedure well and went to the recovery room  in stable condition.  She will be started on her preoperative medicines.      Jonnie Kind, M.D.  Electronically Signed     JVF/MEDQ  D:  08/27/2006  T:  08/27/2006  Job:  [id number]   cc:   Norwood Levo. Moshe Cipro, M.D.  Fax: [phone]

## 2010-11-15 NOTE — Discharge Summary (Signed)
Abigail Hopkins, Abigail Hopkins                 ACCOUNT NO.:  [id number]   MEDICAL RECORD NO.:  [id number]          PATIENT TYPE:  INP   LOCATION:  A206                          FACILITY:  [facility]   PHYSICIAN:  Bonnielee Haff, MD     DATE OF BIRTH:  1960-08-02   DATE OF ADMISSION:  11/18/2004  DATE OF DISCHARGE:  05/30/2006LH                                 DISCHARGE SUMMARY   DISCHARGE DIAGNOSES:  1. Gait disturbances with falls secondary to dilantin toxicity.  2. Abdominal pain likely secondary to constipation and/or acute      pancreatitis.  3. Schizophrenia.  4. Anxiety disorder.  5. Hypertension.   Please review the H&P dictated on Nov 18, 2004, for details regarding the  patient's presenting illness.   BRIEF HOSPITAL COURSE:  #1 - DILANTIN TOXICITY:  The patient is a 50-year-  old African American female who lives in a family rest home and who has a  history of schizophrenia, seizure disorder and hypertension.  The patient  apparently has not had a seizure for the past eight years or so.  The  patient was admitted after she presented to her PMD with a history of falls  for the past three to four months.  These falls have been increasing in  intensity, but not associated with any syncopal or presyncopal episodes.  On  blood work, the patient was found to have a very high dilantin level.  The  patient was thought to have these falls secondary to her dilantin toxicity.  The patient did not undergo any imaging during this hospitalization as there  were no focal neurologic deficits.  She did have a CT back in April of 2006  which showed no acute intracranial abnormalities.  No evidence for  hydrocephalus was seen.  We held the patient's Dilantin and monitored her  levels closely over the period of the next week or so.  Dilantin levels  decreased gradually and returned to normal.  After the dilantin levels  returned to normal, we restarted the Dilantin at lower dose.  The cause for  her high  dilantin level was thought likely secondary to interaction with her  other medications, particularly her psychotropic medications.  Initially on  admission, most of her psychotropic medications were held, but we have  restarted some of them at a different dose level.  The patient was also seen  by physical therapy during her admission.  Initially the patient was having  significant problems ambulating.  However, subsequently her gait improved  and she was able to ambulate with a cane.  There was no loss of balance also  seen yesterday.  The patient has been told to use a cane to ambulate at  least for the next few days.  She may also benefit from physical therapy at  her group home.   #2 - ABDOMINAL PAIN:  About the second day of admission, the patient started  complaining of epigastric abdominal pain.  We obtained an abdominal x-ray,  which showed severe constipation.  The patient was given enemas with only  partial relief.  We also obtained amylase and lipase levels and LFTs.  Her  amylase and lipase were seen to be mildly elevated as were her LFTs.  The  patient subsequently underwent an ultrasound, which showed normal bile duct.  It also showed normal pancreas.  The patient is status post cholecystectomy.  It was thought that her fluctuating amylase and lipase levels and her LFTs  were likely secondary to medications.  Hydrochlorothiazide can definitely  cause increase in amylase and lipase.  Since she is on such a low dose, I  have discontinued her hydrochlorothiazide.  On the day of discharge, her  abdominal pain is completely resolved.  She is able to tolerate p.o. with no  nausea or vomiting.  She is having regular bowel movements.  She is  considered okay for discharge.   #3 - SCHIZOPHRENIA AND OTHER PSYCHIATRIC DISORDERS:  We have restarted the  patient's Zyprexa at her usual dose.  We have also started Klonopin at a  reduced dose.  However, we have held her Geodon and  fluvoxamine.  We also  restarted her Topamax.  I believe that the patient should see Dr. Merlene Laughter  as an outpatient for her seizure disorder.  She should also see her  psychiatrist before these medications should be reinitiated for her.  A  repeat dilantin level will be checked next week before the patient sees her  PMD for a followup visit.  The patient's mental health remained stable  during this admission.  She did have occasional episodes of anxiety and  crying spells.  However, she remained stable most of the time.   #4 - HYPERTENSION:  As mentioned above, hydrochlorothiazide has been  discontinued because of fluctuating amylase and lipase levels.  I have also  increased her Diovan to 160 mg.  Her blood pressure should be followed by  her PMD and further adjustments should be made as needed.   #5 - LOW POTASSIUM:  The patient was found to have a low potassium level  today and she will be supplemented for the same.   DISCHARGE MEDICATIONS:  1. Dilantin 100 mg p.o. q.h.s.  2. Klonopin 0.5 mg p.o. b.i.d.  3. Folic acid 1 mg p.o. daily.  4. Diovan 160 mg p.o. daily.  5. Zyprexa 7.5 mg p.o. q.h.s.  6. Prilosec 40 mg p.o. daily.  7. Colace 100 mg p.o. b.i.d.  8. Topamax 100 mg p.o. daily.  9. Vicodin 5/500 mg one p.o. q.4h. p.r.n. for pain.   I am holding her Geodon, fluvoxamine and hydrochlorothiazide, as well as her  Flagyl, at this time.   FOLLOWUP CARE:  1. With her PMD, Norwood Levo. Moshe Cipro, M.D., in one week.  2. The patient will obtain a dilantin level prior to seeing her PMD.  3. Outpatient consultation with Dr. Merlene Laughter for her seizure disorder.  4. The patient will see her psychiatrist and she needs to call for an      appointment.  I will communicate this to her group home.     PROCEDURES:  Procedures performed during this hospital stay include:  1. An acute abdominal x-ray which showed moderate constipation with      possible fecal impaction in the rectum. 2.  Abdominal ultrasound was also done on this patient which showed no      evidence of acute abnormality.  Specifically, the common bile duct was      normal in size.  The pancreas, spleen and kidneys were normal.  The  patient is status post cholecystectomy.     PHYSICAL ACTIVITY:  The patient needs to use a cane to ambulate.  The  patient will benefit from physical therapy at least two to three times a  week at her group home.   DIET:  The patient may eat a low-salt diet.      GK/MEDQ  D:  11/26/2004  T:  11/26/2004  Job:  [id number]   cc:   [place] Levo. Moshe Cipro, M.D.  [address]  [place], [place] [address]  Fax: [place]. Merlene Laughter, M.D.  [address] [place]  [place] [address]  Fax: [phone]

## 2010-11-28 ENCOUNTER — Other Ambulatory Visit: Payer: Self-pay | Admitting: Family Medicine

## 2010-12-10 ENCOUNTER — Ambulatory Visit: Payer: Self-pay | Admitting: Gastroenterology

## 2010-12-11 ENCOUNTER — Ambulatory Visit (INDEPENDENT_AMBULATORY_CARE_PROVIDER_SITE_OTHER): Payer: Medicaid Other | Admitting: Gastroenterology

## 2010-12-11 ENCOUNTER — Encounter: Payer: Self-pay | Admitting: Gastroenterology

## 2010-12-11 VITALS — BP 147/82 | HR 63 | Temp 97.2°F | Ht 65.0 in | Wt 153.8 lb

## 2010-12-11 DIAGNOSIS — K5909 Other constipation: Secondary | ICD-10-CM | POA: Insufficient documentation

## 2010-12-11 DIAGNOSIS — R7401 Elevation of levels of liver transaminase levels: Secondary | ICD-10-CM

## 2010-12-11 DIAGNOSIS — K7581 Nonalcoholic steatohepatitis (NASH): Secondary | ICD-10-CM | POA: Insufficient documentation

## 2010-12-11 DIAGNOSIS — K7689 Other specified diseases of liver: Secondary | ICD-10-CM

## 2010-12-11 DIAGNOSIS — K5900 Constipation, unspecified: Secondary | ICD-10-CM

## 2010-12-11 DIAGNOSIS — K219 Gastro-esophageal reflux disease without esophagitis: Secondary | ICD-10-CM

## 2010-12-11 MED ORDER — PSYLLIUM 0.52 G PO CAPS: 0.5200 g | ORAL_CAPSULE | Freq: Two times a day (BID) | ORAL | Status: DC

## 2010-12-11 NOTE — Assessment & Plan Note (Signed)
Instructions for fatty liver: Recommend 1-2# weight loss per week until ideal body weight through exercise & diet. Low fat/cholesterol diet. Gradually increase exercise from 15 min daily up to 1 hr per day 5 days/week. Limit alcohol use.   

## 2010-12-11 NOTE — Progress Notes (Signed)
Pt needs refill for metamucil caps sent to [facility].

## 2010-12-11 NOTE — Assessment & Plan Note (Signed)
Doing well. Continue current regimen.  

## 2010-12-11 NOTE — Assessment & Plan Note (Signed)
Doing well on Nexium.

## 2010-12-11 NOTE — Assessment & Plan Note (Signed)
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 Due for lab work at this time.

## 2010-12-11 NOTE — Progress Notes (Signed)
Primary Care Physician: Tula Nakayama, MD, MD  Primary Gastroenterologist:  Barney Drain, MD  Chief Complaint  Patient presents with  . Medication Refill    HPI: Abigail Hopkins is a 50 y.o. female here for followup of constipation, GERD, Abigail Hopkins. Last seen in March of 2010. Overall she is doing well. Her heartburn is controlled on Nexium. Her bowel movements are regular. No blood in the stool or melena. Denies any abdominal pain. Her last blood work was in August of 2011.  Current Outpatient Prescriptions  Medication Sig Dispense Refill  . calcium-vitamin D (OSCAL WITH D) 500-200 MG-UNIT per tablet Take 1 tablet by mouth daily.        Marland Kitchen docusate sodium (COLACE) 100 MG capsule TAKE (1) CAPSULE BY MOUTHTWICE DAILY.  60 capsule  2  . esomeprazole (NEXIUM) 40 MG capsule Take 40 mg by mouth daily before breakfast.        . estradiol (ESTRACE) 0.5 MG tablet Take 0.5 mg by mouth daily.        . hydrochlorothiazide 25 MG tablet Take 12.5 mg by mouth daily.        Marland Kitchen KLONOPIN 0.5 MG tablet TAKE (1) TABLET BY MOUTH TWICE A Hopkins.  60 each  2  . lisinopril (PRINIVIL,ZESTRIL) 20 MG tablet Take 20 mg by mouth daily.        Marland Kitchen OLANZapine (ZYPREXA) 7.5 MG tablet Take 7.5 mg by mouth at bedtime.        . psyllium (METAMUCIL) 0.52 G capsule Take 0.52 g by mouth 2 (two) times daily.        Marland Kitchen topiramate (TOPAMAX) 100 MG tablet Take 100 mg by mouth 2 (two) times daily.        . travoprost, benzalkonium, (TRAVATAN) 0.004 % ophthalmic solution 1 drop at bedtime.        Marland Kitchen DISCONTD: Wheat Dextrin (BENEFIBER) TABS Take by mouth.       . DISCONTD: dexlansoprazole (DEXILANT) 60 MG capsule Take 60 mg by mouth daily.          Allergies as of 12/11/2010  . (No Known Allergies)    ROS:  General: Negative for anorexia, weight loss, fever, chills, fatigue, weakness. ENT: Negative for hoarseness, difficulty swallowing , nasal congestion. CV: Negative for chest pain, angina, palpitations, dyspnea on exertion, peripheral  edema.  Respiratory: Negative for dyspnea at rest, dyspnea on exertion, cough, sputum, wheezing.  GI: See history of present illness. GU:  Negative for dysuria, hematuria, urinary incontinence, urinary frequency, nocturnal urination.  Endo: Negative for unusual weight change.    Physical Examination:   BP 147/82  Pulse 63  Temp(Src) 97.2 F (36.2 C) (Temporal)  Ht 5' 5"  (1.651 m)  Wt 153 lb 12.8 oz (69.763 kg)  BMI 25.59 kg/m2  General: Well-nourished, well-developed in no acute distress.  Eyes: No icterus. Mouth: Oropharyngeal mucosa moist and pink , no lesions erythema or exudate. Lungs: Clear to auscultation bilaterally.  Heart: Regular rate and rhythm, no murmurs rubs or gallops.  Abdomen: Bowel sounds are normal, nontender, nondistended, no hepatosplenomegaly or masses, no abdominal bruits or hernia , no rebound or guarding.   Extremities: No lower extremity edema.  Neuro: Alert and oriented x 4   Skin: Warm and dry, no jaundice.   Psych: Alert and cooperative, normal mood and affect.

## 2010-12-11 NOTE — Progress Notes (Signed)
Addended by: Mahala Menghini on: 12/11/2010 05:19 PM   Modules accepted: Orders

## 2010-12-27 ENCOUNTER — Other Ambulatory Visit: Payer: Self-pay | Admitting: Family Medicine

## 2011-01-07 LAB — HEPATIC FUNCTION PANEL
ALT: 29 U/L (ref 0–35)
AST: 20 U/L (ref 0–37)
Alkaline Phosphatase: 65 U/L (ref 39–117)
Bilirubin, Direct: 0.1 mg/dL (ref 0.0–0.3)
Indirect Bilirubin: 0.3 mg/dL (ref 0.0–0.9)
Total Bilirubin: 0.4 mg/dL (ref 0.3–1.2)

## 2011-01-27 ENCOUNTER — Other Ambulatory Visit: Payer: Self-pay | Admitting: Family Medicine

## 2011-01-28 ENCOUNTER — Other Ambulatory Visit: Payer: Self-pay | Admitting: Family Medicine

## 2011-02-24 ENCOUNTER — Encounter: Payer: Self-pay | Admitting: Family Medicine

## 2011-02-25 ENCOUNTER — Encounter: Payer: Self-pay | Admitting: Family Medicine

## 2011-02-25 ENCOUNTER — Ambulatory Visit (INDEPENDENT_AMBULATORY_CARE_PROVIDER_SITE_OTHER): Payer: Medicaid Other | Admitting: Family Medicine

## 2011-02-25 VITALS — BP 120/78 | HR 63 | Ht 64.0 in | Wt 151.0 lb

## 2011-02-25 DIAGNOSIS — F411 Generalized anxiety disorder: Secondary | ICD-10-CM

## 2011-02-25 DIAGNOSIS — R51 Headache: Secondary | ICD-10-CM

## 2011-02-25 DIAGNOSIS — I10 Essential (primary) hypertension: Secondary | ICD-10-CM

## 2011-02-25 DIAGNOSIS — R5381 Other malaise: Secondary | ICD-10-CM

## 2011-02-25 DIAGNOSIS — E8941 Symptomatic postprocedural ovarian failure: Secondary | ICD-10-CM

## 2011-02-25 DIAGNOSIS — E785 Hyperlipidemia, unspecified: Secondary | ICD-10-CM

## 2011-02-25 MED ORDER — ESTRADIOL 0.5 MG PO TABS: ORAL_TABLET | ORAL | Status: AC

## 2011-02-25 NOTE — Assessment & Plan Note (Signed)
Controlled, no change in medication  

## 2011-02-25 NOTE — Progress Notes (Signed)
  Subjective:    Patient ID: Abigail Hopkins, female    DOB: 1960/09/25, 50 y.o.   MRN: [id number]  HPI The PT is here for follow up and re-evaluation of chronic medical conditions, medication management and review of any available recent lab and radiology data.  Preventive health is updated, specifically  Cancer screening and Immunization.   Questions or concerns regarding consultations or procedures which the PT has had in the interim are  addressed. The PT denies any adverse reactions to current medications since the last visit.  There are no new concerns.  There are no specific complaints       Review of Systems Denies recent fever or chills. Denies sinus pressure, nasal congestion, ear pain or sore throat. Denies chest congestion, productive cough or wheezing. Denies chest pains, palpitations and leg swelling Denies abdominal pain, nausea, vomiting,diarrhea or constipation.   Denies dysuria, frequency, hesitancy or incontinence. Denies joint pain, swelling and limitation in mobility. Denies headaches, seizures, numbness, or tingling. Denies depression, anxiety or insomnia. Denies skin break down or rash.        Objective:   Physical Exam Patient alert and oriented and in no cardiopulmonary distress.  HEENT: No facial asymmetry, EOMI, no sinus tenderness,  oropharynx pink and moist.  Neck supple no adenopathy.  Chest: Clear to auscultation bilaterally.  CVS: S1, S2 no murmurs, no S3.  ABD: Soft non tender. Bowel sounds normal.  Ext: No edema  MS: Adequate ROM spine, shoulders, hips and knees.  Skin: Intact, no ulcerations or rash noted.  Psych: Good eye contact, flat  affect. Memory impaired not anxious or depressed appearing.Miold mental retardation.  CNS: CN 2-12 intact, power, tone and sensation normal throughout.        Assessment & Plan:

## 2011-02-25 NOTE — Assessment & Plan Note (Signed)
Reduce estrogen with a plan to quit in 3 months

## 2011-02-25 NOTE — Patient Instructions (Signed)
F/u in 4.5 months.   Reduce estradiol to half tablet Monday , Wednesday and Friday only for the next 3 months, then stop   Fasting labs in 4.5 months   LABWORK  NEEDS TO BE DONE BETWEEN 3 TO 7 DAYS BEFORE YOUR NEXT SCEDULED  VISIT.  THIS WILL IMPROVE THE QUALITY OF YOUR CARE.   Mammogram is due in November, pls schedule  Nurse visits in September for flu vaccine

## 2011-02-28 ENCOUNTER — Other Ambulatory Visit: Payer: Self-pay | Admitting: Gastroenterology

## 2011-02-28 ENCOUNTER — Other Ambulatory Visit: Payer: Self-pay | Admitting: Family Medicine

## 2011-03-05 ENCOUNTER — Other Ambulatory Visit: Payer: Self-pay | Admitting: *Deleted

## 2011-03-05 MED ORDER — OLANZAPINE 7.5 MG PO TABS: 7.5000 mg | ORAL_TABLET | Freq: Every day | ORAL | Status: DC

## 2011-03-05 MED ORDER — HYDROCHLOROTHIAZIDE 12.5 MG PO CAPS: 12.5000 mg | ORAL_CAPSULE | ORAL | Status: DC

## 2011-03-05 MED ORDER — TOPIRAMATE 100 MG PO TABS: 100.0000 mg | ORAL_TABLET | Freq: Every day | ORAL | Status: DC

## 2011-03-05 MED ORDER — LISINOPRIL 20 MG PO TABS: 20.0000 mg | ORAL_TABLET | Freq: Every day | ORAL | Status: DC

## 2011-03-12 ENCOUNTER — Other Ambulatory Visit: Payer: Self-pay | Admitting: Family Medicine

## 2011-03-12 DIAGNOSIS — Z139 Encounter for screening, unspecified: Secondary | ICD-10-CM

## 2011-03-18 ENCOUNTER — Ambulatory Visit (HOSPITAL_COMMUNITY): Payer: Medicaid Other

## 2011-03-25 ENCOUNTER — Ambulatory Visit (HOSPITAL_COMMUNITY)
Admission: RE | Admit: 2011-03-25 | Discharge: 2011-03-25 | Disposition: A | Discharge status: Home / Self Care | Payer: Medicaid Other | Source: Ambulatory Visit | Attending: Family Medicine | Admitting: Family Medicine

## 2011-03-25 DIAGNOSIS — Z1231 Encounter for screening mammogram for malignant neoplasm of breast: Secondary | ICD-10-CM | POA: Insufficient documentation

## 2011-03-25 DIAGNOSIS — Z139 Encounter for screening, unspecified: Secondary | ICD-10-CM

## 2011-03-31 ENCOUNTER — Other Ambulatory Visit: Payer: Self-pay | Admitting: Family Medicine

## 2011-05-28 ENCOUNTER — Other Ambulatory Visit: Payer: Self-pay | Admitting: Family Medicine

## 2011-06-04 ENCOUNTER — Telehealth: Payer: Self-pay

## 2011-06-04 MED ORDER — ESOMEPRAZOLE MAGNESIUM 40 MG PO CPDR: 40.0000 mg | DELAYED_RELEASE_CAPSULE | Freq: Every day | ORAL | Status: DC

## 2011-06-04 MED ORDER — PANTOPRAZOLE SODIUM 40 MG PO TBEC: 40.0000 mg | DELAYED_RELEASE_TABLET | Freq: Every day | ORAL | Status: DC

## 2011-06-04 NOTE — Telephone Encounter (Signed)
Done

## 2011-06-04 NOTE — Telephone Encounter (Signed)
Called pts nursing home to find out if pt has ever tried protonix because her medicaid will not pay for the nexium anymore. Was informed that pt has not tried protonix. Pt has tried omeprazole and dexilant. She will have to try protonix and fail before medicaid will pay for nexium. Pt needs new rx sent to [facility].

## 2011-06-04 NOTE — Telephone Encounter (Deleted)
DONE

## 2011-07-04 ENCOUNTER — Other Ambulatory Visit: Payer: Self-pay | Admitting: Family Medicine

## 2011-07-23 ENCOUNTER — Encounter: Payer: Self-pay | Admitting: Family Medicine

## 2011-07-25 ENCOUNTER — Encounter: Payer: Self-pay | Admitting: Family Medicine

## 2011-07-28 ENCOUNTER — Ambulatory Visit (INDEPENDENT_AMBULATORY_CARE_PROVIDER_SITE_OTHER): Payer: Medicaid Other | Admitting: Family Medicine

## 2011-07-28 ENCOUNTER — Encounter: Payer: Self-pay | Admitting: Family Medicine

## 2011-07-28 VITALS — BP 120/80 | HR 81 | Resp 16 | Ht 64.0 in | Wt 148.4 lb

## 2011-07-28 DIAGNOSIS — R109 Unspecified abdominal pain: Secondary | ICD-10-CM

## 2011-07-28 DIAGNOSIS — R5381 Other malaise: Secondary | ICD-10-CM

## 2011-07-28 DIAGNOSIS — R1013 Epigastric pain: Secondary | ICD-10-CM

## 2011-07-28 DIAGNOSIS — F411 Generalized anxiety disorder: Secondary | ICD-10-CM

## 2011-07-28 DIAGNOSIS — I10 Essential (primary) hypertension: Secondary | ICD-10-CM

## 2011-07-28 DIAGNOSIS — R5383 Other fatigue: Secondary | ICD-10-CM

## 2011-07-28 DIAGNOSIS — K3189 Other diseases of stomach and duodenum: Secondary | ICD-10-CM

## 2011-07-28 DIAGNOSIS — F29 Unspecified psychosis not due to a substance or known physiological condition: Secondary | ICD-10-CM

## 2011-07-28 DIAGNOSIS — F419 Anxiety disorder, unspecified: Secondary | ICD-10-CM

## 2011-07-28 DIAGNOSIS — E785 Hyperlipidemia, unspecified: Secondary | ICD-10-CM

## 2011-07-28 LAB — LIPID PANEL
Cholesterol: 191 mg/dL (ref 0–200)
Triglycerides: 65 mg/dL (ref <150)

## 2011-07-28 LAB — CBC WITH DIFFERENTIAL/PLATELET
Basophils Relative: 0 % (ref 0–1)
Eosinophils Absolute: 0 10*3/uL (ref 0.0–0.7)
HCT: 38.9 % (ref 36.0–46.0)
Hemoglobin: 13.4 g/dL (ref 12.0–15.0)
MCH: 32.1 pg (ref 26.0–34.0)
MCHC: 34.4 g/dL (ref 30.0–36.0)
Monocytes Absolute: 0.3 10*3/uL (ref 0.1–1.0)
Monocytes Relative: 4 % (ref 3–12)
Neutrophils Relative %: 47 % (ref 43–77)

## 2011-07-28 NOTE — Patient Instructions (Signed)
f/u in 4 months. Labs today, hpylori, cbc, tsh and cmp.  You will be referred to GI re abdominal pain.  No new meds at this time

## 2011-07-29 ENCOUNTER — Other Ambulatory Visit: Payer: Self-pay | Admitting: Family Medicine

## 2011-07-29 LAB — COMPLETE METABOLIC PANEL WITH GFR
BUN: 13 mg/dL (ref 6–23)
CO2: 23 mEq/L (ref 19–32)
Calcium: 9.9 mg/dL (ref 8.4–10.5)
Chloride: 108 mEq/L (ref 96–112)
Creat: 0.98 mg/dL (ref 0.50–1.10)
GFR, Est African American: 77 mL/min
GFR, Est Non African American: 67 mL/min
Glucose, Bld: 99 mg/dL (ref 70–99)

## 2011-07-31 ENCOUNTER — Ambulatory Visit (INDEPENDENT_AMBULATORY_CARE_PROVIDER_SITE_OTHER): Payer: Medicaid Other | Admitting: Gastroenterology

## 2011-07-31 ENCOUNTER — Encounter: Payer: Self-pay | Admitting: Gastroenterology

## 2011-07-31 DIAGNOSIS — R7402 Elevation of levels of lactic acid dehydrogenase (LDH): Secondary | ICD-10-CM

## 2011-07-31 DIAGNOSIS — R109 Unspecified abdominal pain: Secondary | ICD-10-CM

## 2011-07-31 DIAGNOSIS — K5900 Constipation, unspecified: Secondary | ICD-10-CM

## 2011-07-31 DIAGNOSIS — R7401 Elevation of levels of liver transaminase levels: Secondary | ICD-10-CM

## 2011-07-31 MED ORDER — AMOXICILLIN-POT CLAVULANATE 500-125 MG PO TABS: 1.0000 | ORAL_TABLET | Freq: Two times a day (BID) | ORAL | Status: AC

## 2011-07-31 NOTE — Progress Notes (Signed)
Referring Provider: Syliva Overman, MD Primary Care Physician:  Syliva Overman, MD, MD Primary Gastroenterologist: Dr. Darrick Penna  Chief Complaint  Patient presents with  . Abdominal Pain    HPI:   Abigail Hopkins presents today in follow-up for abdominal pain, hx of constipation, NASH with elevated LFTs. Seen in June and doing well. Has hx of chronic abdominal pain, SBBO in past. Hx of psychotic disorder makes somewhat difficult to obtain complete hx.  Complains of mid-abdominal pain. Unsure how long been going on. Just "paining". Comes and goes. Not worse with eating. Better after a BM sometimes. BM every day. Stools not hard.  No diarrhea. No blood in stool. No melena. Doesn't feel constipated. No fever or chills. No reflux, no dysphagia. One time of nausea. No vomiting.  Stays at [facility].  Down about 6 lbs on purpose.    Jan 2013: no anemia, CMP normal, H.pylori antibody negative.   Past Medical History  Diagnosis Date  . HTN (hypertension)   . Psychotic disorder   . Anxiety   . Obesity     mild  . GI problem     h/o SB bacterial overgrowth  . Constipation   . GERD (gastroesophageal reflux disease)   . NASH (nonalcoholic steatohepatitis)     Past Surgical History  Procedure Date  . Cholecystectomy   . Complete hysterectomy   . Abdominal surgery   . Colonoscopy 06/2008    normal TI, small hermorrhoids  . Esophagogastroduodenoscopy 11/2007    chronic, mild gastritis, no H.Pylori    Current Outpatient Prescriptions  Medication Sig Dispense Refill  . calcium-vitamin D (OSCAL WITH D) 500-200 MG-UNIT per tablet Take 1 tablet by mouth daily.        Marland Kitchen docusate sodium (COLACE) 100 MG capsule TAKE (1) CAPSULE BY MOUTH TWICE DAILY.  60 capsule  7  . hydrochlorothiazide (,MICROZIDE/HYDRODIURIL,) 12.5 MG capsule Take 1 capsule (12.5 mg total) by mouth every morning.  30 capsule  5  . KLONOPIN 0.5 MG tablet TAKE (1) TABLET BY MOUTH TWICE A DAY.  60 each  4  . lisinopril  (PRINIVIL) 20 MG tablet Take 1 tablet (20 mg total) by mouth daily.  30 tablet  5  . OLANZapine (ZYPREXA) 7.5 MG tablet Take 1 tablet (7.5 mg total) by mouth at bedtime.  30 tablet  5  . pantoprazole (PROTONIX) 40 MG tablet Take 1 tablet (40 mg total) by mouth daily.  31 tablet  11  . psyllium (METAMUCIL) 0.52 G capsule Take 1 capsule (0.52 g total) by mouth 2 (two) times daily.  60 capsule  11  . topiramate (TOPAMAX) 100 MG tablet Take 1 tablet (100 mg total) by mouth daily.  30 tablet  5  . travoprost, benzalkonium, (TRAVATAN) 0.004 % ophthalmic solution 1 drop at bedtime.          Allergies as of 07/31/2011  . (No Known Allergies)     History   Social History  . Marital Status: Widowed    Spouse Name: N/A    Number of Children: N/A  . Years of Education: N/A   Social History Main Topics  . Smoking status: Former Smoker -- 1.0 packs/day    Types: Cigarettes  . Smokeless tobacco: None  . Alcohol Use: No  . Drug Use: No  . Sexually Active: None   Other Topics Concern  . None   Social History Narrative  . None    Review of Systems: Gen: Denies fever, chills, anorexia. Denies fatigue,  weakness, weight loss.  CV: Denies chest pain, palpitations, syncope, peripheral edema, and claudication. Resp: Denies dyspnea at rest, cough, wheezing, coughing up blood, and pleurisy. GI: Denies vomiting blood, jaundice, and fecal incontinence.   Denies dysphagia or odynophagia. Derm: Denies rash, itching, dry skin Psych: Denies depression, anxiety, memory loss, confusion. No homicidal or suicidal ideation.  Heme: Denies bruising, bleeding, and enlarged lymph nodes.  Physical Exam: BP 131/73  Pulse 74  Temp(Src) 98.2 F (36.8 C) (Temporal)  Ht 5' (1.524 m)  Wt 147 lb 12.8 oz (67.042 kg)  BMI 28.87 kg/m2 General:   Alert and oriented. No distress noted. Overall flat affect but will laugh occasionally. Head:  Normocephalic and atraumatic. Eyes:  Conjuctiva clear without scleral  icterus. Mouth:  Oral mucosa pink and moist. Good dentition. No lesions. Neck:  Supple, without mass or thyromegaly. Heart:  S1, S2 present without murmurs, rubs, or gallops. Regular rate and rhythm. Abdomen:  +BS, soft, non-tender and non-distended. No rebound or guarding. No HSM or masses noted. Msk:  Symmetrical without gross deformities. Normal posture. Extremities:  Without edema. Neurologic:  Alert and  oriented x4;  grossly normal neurologically. Skin:  Intact without significant lesions or rashes. Cervical Nodes:  No significant cervical adenopathy. Psych:  Alert and cooperative.

## 2011-07-31 NOTE — Patient Instructions (Addendum)
Continue to take fiber daily and your stool softener.  I have sent a prescription for antibiotics to your pharmacy to take twice a day for seven days. You have a history of bacterial overgrowth in your intestines, which can cause some of these symptoms you are having. We will treat this and follow-up with you in about 4 weeks.   If you are not improved at our next visit, we may need to proceed with an upper endoscopy or maybe a CT scan.   Please call us with any questions in the meantime.

## 2011-08-03 NOTE — Assessment & Plan Note (Signed)
Secondary to NASH. CMP in Jan 2013 normal. Repeat in 6 mos.

## 2011-08-03 NOTE — Assessment & Plan Note (Signed)
51 year old female with hx of chronic abdominal pain. Difficult to obtain a clear hx from pt due to mental hx. However, appears to note intermittent "paining", sometimes better after BM but not necessarily. No melena, hematochezia, N/V. Labs overall WNL. Has hx of SBBO. Will treat empirically, return in 4 weeks. If no improvement, consider further work-up via EGD or CT, depending on symptoms.

## 2011-08-03 NOTE — Assessment & Plan Note (Signed)
Appears well-controlled. Question if underlying constipation exacerbating abdominal discomfort. BM every day per pt, does not feel constipated. Return in 4 weeks.

## 2011-08-04 NOTE — Assessment & Plan Note (Signed)
Controlled, no change in medication  

## 2011-08-04 NOTE — Progress Notes (Signed)
EGD 2009. TCS 2010 CT A/P 2009 FOR CHRONIC ABDOMINAL PAIN. UNLESS PT HAS ALARM SX WOULD ONLY REPEAT EGD IF SHE IS HAVING EPIGASTRIC DISCOMFORT.   REVIEWED.

## 2011-08-04 NOTE — Progress Notes (Signed)
  Subjective:    Patient ID: Abigail Hopkins, female    DOB: [birth year], 51 y.o.   MRN: [id number]  HPI The PT is here for follow up and re-evaluation of chronic medical conditions, medication management and review of any available recent lab and radiology data.  Preventive health is updated, specifically  Cancer screening and Immunization.   Questions or concerns regarding consultations or procedures which the PT has had in the interim are  addressed. The PT denies any adverse reactions to current medications since the last visit.  C/o increased abdominal [pain and bloating in the past month. No change in bowel movements, no nausea or vomiting, no urinary symptoms. Mild increase in anxiety noted, Mom passed in the past approx 6 weeks    Review of Systems See HPI Denies recent fever or chills. Denies sinus pressure, nasal congestion, ear pain or sore throat. Denies chest congestion, productive cough or wheezing. Denies chest pains, palpitations and leg swelling Denies joint pain, swelling and limitation in mobility. Denies headaches, seizures, numbness, or tingling. Denies uncontrolled  depression, anxiety or insomnia. Denies skin break down or rash.        Objective:   Physical Exam Patient alert and oriented and in no cardiopulmonary distress.  HEENT: No facial asymmetry, EOMI, no sinus tenderness,  oropharynx pink and moist.  Neck supple no adenopathy.  Chest: Clear to auscultation bilaterally.  CVS: S1, S2 no murmurs, no S3.  ABD: Soft non tender. Bowel sounds normal.  Ext: No edema  MS: Adequate ROM spine, shoulders, hips and knees.  Skin: Intact, no ulcerations or rash noted.  Psych: Good eye contact,flat  affect. Memory intact mildly anxious not depressed appearing.  CNS: CN 2-12 intact, power, tone and sensation normal throughout.        Assessment & Plan:

## 2011-08-04 NOTE — Progress Notes (Signed)
Faxed to PCP

## 2011-08-04 NOTE — Assessment & Plan Note (Signed)
Increased and uncontrolled in the past several weeks per pt report, will have GI re eval

## 2011-08-04 NOTE — Assessment & Plan Note (Signed)
Elevated LDL, dietary management only indicated at this time

## 2011-08-04 NOTE — Assessment & Plan Note (Signed)
Appears slightly increased and uncontrolled. Has recently lost her Mom so this may be contributing

## 2011-08-27 ENCOUNTER — Other Ambulatory Visit: Payer: Self-pay | Admitting: Family Medicine

## 2011-08-28 ENCOUNTER — Telehealth: Payer: Self-pay | Admitting: Gastroenterology

## 2011-08-28 ENCOUNTER — Ambulatory Visit: Payer: Medicaid Other | Admitting: Gastroenterology

## 2011-08-28 NOTE — Telephone Encounter (Signed)
Pt was a no show

## 2011-09-08 ENCOUNTER — Telehealth: Payer: Self-pay | Admitting: Family Medicine

## 2011-09-08 NOTE — Telephone Encounter (Signed)
Prior authorization completed and approved. Pharmacy aware.

## 2011-11-25 ENCOUNTER — Encounter: Payer: Self-pay | Admitting: Family Medicine

## 2011-11-25 ENCOUNTER — Ambulatory Visit (INDEPENDENT_AMBULATORY_CARE_PROVIDER_SITE_OTHER): Payer: Medicaid Other | Admitting: Family Medicine

## 2011-11-25 VITALS — BP 126/74 | HR 96 | Resp 18 | Ht 64.0 in | Wt 143.1 lb

## 2011-11-25 DIAGNOSIS — E785 Hyperlipidemia, unspecified: Secondary | ICD-10-CM

## 2011-11-25 DIAGNOSIS — F411 Generalized anxiety disorder: Secondary | ICD-10-CM

## 2011-11-25 DIAGNOSIS — F29 Unspecified psychosis not due to a substance or known physiological condition: Secondary | ICD-10-CM

## 2011-11-25 DIAGNOSIS — K219 Gastro-esophageal reflux disease without esophagitis: Secondary | ICD-10-CM

## 2011-11-25 DIAGNOSIS — I10 Essential (primary) hypertension: Secondary | ICD-10-CM

## 2011-11-25 NOTE — Patient Instructions (Signed)
F/u in 4.5 months, call if you need me before.  Mammogram is due end September, please schedule when you are notified   It is important that you exercise regularly at least 30 minutes 5 times a week. If you develop chest pain, have severe difficulty breathing, or feel very tired, stop exercising immediately and seek medical attention   A healthy diet is rich in fruit, vegetables and whole grains. Poultry fish, nuts and beans are a healthy choice for protein rather then red meat. A low sodium diet and drinking 64 ounces of water daily is generally recommended. Oils and sweet should be limited. Carbohydrates especially for those who are diabetic or overweight, should be limited to 30-45 gram per meal. It is important to eat on a regular schedule, at least 3 times daily. Snacks should be primarily fruits, vegetables or nuts.  No changes in medication, you are doing well

## 2011-11-25 NOTE — Progress Notes (Signed)
  Subjective:    Patient ID: Abigail Hopkins, female    DOB: July 26, 1960, 51 y.o.   MRN: [id number]  HPI The PT is here for follow up and re-evaluation of chronic medical conditions, medication management and review of any available recent lab and radiology data.  Preventive health is updated, specifically  Cancer screening and Immunization.   Questions or concerns regarding consultations or procedures which the PT has had in the interim are  addressed. The PT denies any adverse reactions to current medications since the last visit.  There are no new concerns.  There are no specific complaints       Review of Systems See HPI Denies recent fever or chills. Denies sinus pressure, nasal congestion, ear pain or sore throat. Denies chest congestion, productive cough or wheezing. Denies chest pains, palpitations and leg swelling Denies abdominal pain, nausea, vomiting,diarrhea or constipation.   Denies dysuria, frequency, hesitancy or incontinence. Denies joint pain, swelling and limitation in mobility. Denies headaches, seizures, numbness, or tingling. Denies depression, anxiety or insomnia. Denies skin break down or rash.        Objective:   Physical Exam Patient alert and oriented and in no cardiopulmonary distress.  HEENT: No facial asymmetry, EOMI, no sinus tenderness,  oropharynx pink and moist.  Neck supple no adenopathy.  Chest: Clear to auscultation bilaterally.  CVS: S1, S2 no murmurs, no S3.  ABD: Soft non tender. Bowel sounds normal.  Ext: No edema  MS: Adequate ROM spine, shoulders, hips and knees.  Skin: Intact, no ulcerations or rash noted.  Psych: Good eye contact, normal affect. Memory intact not anxious or depressed appearing.  CNS: CN 2-12 intact, power, tone and sensation normal throughout.        Assessment & Plan:

## 2011-11-30 NOTE — Assessment & Plan Note (Signed)
Controlled, no change in medication  

## 2011-11-30 NOTE — Assessment & Plan Note (Signed)
Elevated LDL, advised to follow low fat diet

## 2011-12-01 ENCOUNTER — Telehealth: Payer: Self-pay | Admitting: Family Medicine

## 2011-12-01 MED ORDER — CALCIUM CARBONATE-VITAMIN D 500-200 MG-UNIT PO TABS: 1.0000 | ORAL_TABLET | Freq: Every day | ORAL | Status: DC

## 2011-12-01 NOTE — Telephone Encounter (Signed)
Sent in

## 2011-12-22 ENCOUNTER — Other Ambulatory Visit: Payer: Self-pay | Admitting: Family Medicine

## 2011-12-23 ENCOUNTER — Other Ambulatory Visit: Payer: Self-pay

## 2011-12-23 MED ORDER — PSYLLIUM 0.52 G PO CAPS: 0.5200 g | ORAL_CAPSULE | Freq: Two times a day (BID) | ORAL | Status: DC

## 2012-01-23 ENCOUNTER — Encounter: Payer: Self-pay | Admitting: Pediatrics

## 2012-01-26 ENCOUNTER — Ambulatory Visit (INDEPENDENT_AMBULATORY_CARE_PROVIDER_SITE_OTHER): Payer: Medicaid Other | Admitting: Gastroenterology

## 2012-01-26 ENCOUNTER — Encounter: Payer: Self-pay | Admitting: Gastroenterology

## 2012-01-26 VITALS — BP 133/83 | HR 59 | Temp 98.2°F | Ht 60.0 in | Wt 141.0 lb

## 2012-01-26 DIAGNOSIS — R109 Unspecified abdominal pain: Secondary | ICD-10-CM

## 2012-01-26 DIAGNOSIS — K7581 Nonalcoholic steatohepatitis (NASH): Secondary | ICD-10-CM

## 2012-01-26 DIAGNOSIS — K7689 Other specified diseases of liver: Secondary | ICD-10-CM

## 2012-01-26 NOTE — Progress Notes (Signed)
Referring Provider: Kerri Perches, MD Primary Care Physician:  Syliva Overman, MD Primary Gastroenterologist: Dr. Darrick Penna   Chief Complaint  Patient presents with  . Follow-up    HPI:   Abigail Hopkins presents today in follow-up for hx of chronic abdominal pain, SBBO, constipation, NASH with elevated LFTs. Last seen in Jan 2013. Down 6 lbs. Eating small portions. Good appetite, 3 meals per day. No N/V. No GERD, dysphagia. Notes epigastric discomfort intermittently, not associated with anything, lasts a few minutes. "just comes and goes". Caregiver states only mentioned one time since last time seen. No melena. Hx psychotic disorder, difficult to obtain complete, clear hx. Last EGD June 2009. Treated empirically for SBBO at last visit. States noted improvement with this.  BM daily.   Cut out sodas, koolaid, cut back on desserts.    Past Medical History  Diagnosis Date  . HTN (hypertension)   . Psychotic disorder   . Anxiety   . Obesity     mild  . GI problem     h/o SB bacterial overgrowth  . Constipation   . GERD (gastroesophageal reflux disease)   . NASH (nonalcoholic steatohepatitis)     Past Surgical History  Procedure Date  . Cholecystectomy   . Complete hysterectomy   . Abdominal surgery   . Colonoscopy 06/2008    normal TI, small hermorrhoids  . Esophagogastroduodenoscopy 10/2007    chronic, mild gastritis, no H.Pylori  . Hydrogen breath test 07/12/2008    Hydrogen level consistent with small bowel bacterial  overgrowth    Current Outpatient Prescriptions  Medication Sig Dispense Refill  . calcium-vitamin D (OSCAL WITH D) 500-200 MG-UNIT per tablet Take 1 tablet by mouth daily.  30 tablet  5  . docusate sodium (COLACE) 100 MG capsule TAKE (1) CAPSULE BY MOUTH TWICE DAILY.  60 capsule  7  . hydrochlorothiazide (MICROZIDE) 12.5 MG capsule TAKE ONE CAPSULE BY MOUTH DAILY.  30 capsule  4  . KLONOPIN 0.5 MG tablet TAKE (1) TABLET BY MOUTH TWICE A DAY.  60 each  4  .  pantoprazole (PROTONIX) 40 MG tablet Take 1 tablet (40 mg total) by mouth daily.  31 tablet  11  . PRINIVIL 20 MG tablet TAKE ONE TABLET BY MOUTH DAILY.  30 each  3  . psyllium (METAMUCIL) 0.52 G capsule Take 1 capsule (0.52 g total) by mouth 2 (two) times daily.  60 capsule  11  . TOPAMAX 100 MG tablet TAKE ONE TABLET BY MOUTH ONCE DAILY.  30 each  4  . travoprost, benzalkonium, (TRAVATAN) 0.004 % ophthalmic solution 1 drop at bedtime.       Marland Kitchen ZYPREXA 7.5 MG tablet TAKE (1) TABLET BY MOUTH AT BEDTIME.  30 each  4    Allergies as of 01/26/2012  . (No Known Allergies)     History   Social History  . Marital Status: Widowed    Spouse Name: N/A    Number of Children: N/A  . Years of Education: N/A   Social History Main Topics  . Smoking status: Former Smoker -- 1.0 packs/day    Types: Cigarettes  . Smokeless tobacco: None  . Alcohol Use: No  . Drug Use: No  . Sexually Active: None   Other Topics Concern  . None   Social History Narrative  . None    Review of Systems: Gen: +wt loss, intentional CV: Denies chest pain, palpitations, syncope, peripheral edema, and claudication. Resp: Denies dyspnea at rest, cough, wheezing, coughing  up blood, and pleurisy. GI: SEE HPI Derm: Denies rash, itching, dry skin Psych: Denies depression, anxiety, memory loss, confusion. No homicidal or suicidal ideation.  Heme: Denies bruising, bleeding, and enlarged lymph nodes.  Physical Exam: BP 133/83  Pulse 59  Temp 98.2 F (36.8 C) (Temporal)  Ht 5' (1.524 m)  Wt 141 lb (63.957 kg)  BMI 27.54 kg/m2 General:   Alert and oriented. No distress noted. Pleasant and cooperative.  Head:  Normocephalic and atraumatic. Eyes:  Conjuctiva clear without scleral icterus. Heart:  S1, S2 present without murmurs, rubs, or gallops. Regular rate and rhythm. Abdomen:  +BS, soft, non-tender and non-distended. No rebound or guarding. No HSM or masses noted. Msk:  Symmetrical without gross deformities.  Normal posture. Extremities:  Without edema. Neurologic:  Alert and  oriented x4;  grossly normal neurologically. Skin:  Intact without significant lesions or rashes. Psych:  Flat affect, cooperative.

## 2012-01-26 NOTE — Progress Notes (Signed)
Faxed to PCP

## 2012-01-26 NOTE — Assessment & Plan Note (Signed)
51 year old female with hx of chronic abdominal pain. Notes improvement after empirical treatment for SBBO in Jan 2013. Does note epigastric discomfort, no associated factors, lasting just a few minutes. Poor historian. Caregiver states only mentioned discomfort once since January. Wt loss of additional 6 lbs since jan but has cut out high-sugar drinks and desserts. Will hold off on EGD currently as no alarm symptoms. Will follow wt closely. Return in 3 months. Caregiver and patient instructed on signs/symptoms to report. Right now, likely dealing with chronic abdominal pain.

## 2012-01-26 NOTE — Assessment & Plan Note (Signed)
Due for recheck of LFTs. If normal, repeat in 1 year.

## 2012-01-26 NOTE — Patient Instructions (Addendum)
Please have blood work completed today. We will call you with the results.  Please contact us if any further belly pain, worsening of pain, nausea, vomiting.   Dr. Oneida Alar will see you in 3 months.

## 2012-01-29 ENCOUNTER — Other Ambulatory Visit: Payer: Self-pay | Admitting: Family Medicine

## 2012-01-30 LAB — HEPATIC FUNCTION PANEL
Albumin: 4.8 g/dL (ref 3.5–5.2)
Alkaline Phosphatase: 66 U/L (ref 39–117)
Total Bilirubin: 0.3 mg/dL (ref 0.3–1.2)
Total Protein: 7.1 g/dL (ref 6.0–8.3)

## 2012-02-09 NOTE — Progress Notes (Signed)
Quick Note:  Hx of NASH. Labs look excellent Repeat in 1 year. ______

## 2012-02-10 ENCOUNTER — Other Ambulatory Visit: Payer: Self-pay | Admitting: Gastroenterology

## 2012-02-10 DIAGNOSIS — K7581 Nonalcoholic steatohepatitis (NASH): Secondary | ICD-10-CM

## 2012-02-23 NOTE — Progress Notes (Signed)
HFP QYEAR-REVIEWED.

## 2012-03-15 ENCOUNTER — Other Ambulatory Visit: Payer: Self-pay | Admitting: Family Medicine

## 2012-03-15 DIAGNOSIS — Z139 Encounter for screening, unspecified: Secondary | ICD-10-CM

## 2012-03-29 ENCOUNTER — Ambulatory Visit (HOSPITAL_COMMUNITY)
Admission: RE | Admit: 2012-03-29 | Discharge: 2012-03-29 | Disposition: A | Discharge status: Home / Self Care | Payer: Medicaid Other | Source: Ambulatory Visit | Attending: Family Medicine | Admitting: Family Medicine

## 2012-03-29 DIAGNOSIS — Z139 Encounter for screening, unspecified: Secondary | ICD-10-CM

## 2012-03-29 DIAGNOSIS — Z1231 Encounter for screening mammogram for malignant neoplasm of breast: Secondary | ICD-10-CM | POA: Insufficient documentation

## 2012-04-15 ENCOUNTER — Ambulatory Visit (INDEPENDENT_AMBULATORY_CARE_PROVIDER_SITE_OTHER): Payer: Medicaid Other | Admitting: Family Medicine

## 2012-04-15 ENCOUNTER — Encounter: Payer: Self-pay | Admitting: Family Medicine

## 2012-04-15 VITALS — BP 130/80 | HR 98 | Resp 18 | Ht 64.0 in | Wt 144.0 lb

## 2012-04-15 DIAGNOSIS — R5381 Other malaise: Secondary | ICD-10-CM

## 2012-04-15 DIAGNOSIS — K219 Gastro-esophageal reflux disease without esophagitis: Secondary | ICD-10-CM

## 2012-04-15 DIAGNOSIS — Z23 Encounter for immunization: Secondary | ICD-10-CM

## 2012-04-15 DIAGNOSIS — F411 Generalized anxiety disorder: Secondary | ICD-10-CM

## 2012-04-15 DIAGNOSIS — I10 Essential (primary) hypertension: Secondary | ICD-10-CM

## 2012-04-15 DIAGNOSIS — E785 Hyperlipidemia, unspecified: Secondary | ICD-10-CM

## 2012-04-15 DIAGNOSIS — F29 Unspecified psychosis not due to a substance or known physiological condition: Secondary | ICD-10-CM

## 2012-04-15 NOTE — Progress Notes (Signed)
  Subjective:    Patient ID: Abigail Hopkins, female    DOB: 01/02/1961, 50 y.o.   MRN: [id number]  HPI The PT is here for follow up and re-evaluation of chronic medical conditions, medication management and review of any available recent lab and radiology data.  Preventive health is updated, specifically  Cancer screening and Immunization.   Questions or concerns regarding consultations or procedures which the PT has had in the interim are  addressed. The PT denies any adverse reactions to current medications since the last visit.  There are no new concerns.  There are no specific complaints       Review of Systems See HPI Denies recent fever or chills. Denies sinus pressure, nasal congestion, ear pain or sore throat. Denies chest congestion, productive cough or wheezing. Denies chest pains, palpitations and leg swelling Denies abdominal pain, nausea, vomiting,diarrhea or constipation.   Denies dysuria, frequency, hesitancy or incontinence. Denies joint pain, swelling and limitation in mobility. Denies headaches, seizures, numbness, or tingling. Denies depression, anxiety or insomnia. Denies skin break down or rash.        Objective:   Physical Exam Patient alert and oriented and in no cardiopulmonary distress.  HEENT: No facial asymmetry, EOMI, no sinus tenderness,  oropharynx pink and moist.  Neck supple no adenopathy.  Chest: Clear to auscultation bilaterally.  CVS: S1, S2 no murmurs, no S3.  ABD: Soft non tender. Bowel sounds normal.  Ext: No edema  MS: Adequate ROM spine, shoulders, hips and knees.  Skin: Intact, no ulcerations or rash noted.  Psych: Good eye contact, normal affect. Memory intact not anxious or depressed appearing.  CNS: CN 2-12 intact, power, tone and sensation normal throughout.        Assessment & Plan:   

## 2012-04-15 NOTE — Patient Instructions (Addendum)
Pelvic and breast exam end February.  Flu vaccine today  You are doing well, call if you need me before, no med changes  CBC, fasting chem 7, lipid, TSH end February before next visit

## 2012-04-16 NOTE — Assessment & Plan Note (Signed)
Elevated LDL Hyperlipidemia:Low fat diet discussed and encouraged.

## 2012-04-16 NOTE — Assessment & Plan Note (Signed)
Controlled, no change in medication DASH diet and commitment to daily physical activity for a minimum of 30 minutes discussed and encouraged, as a part of hypertension management. The importance of attaining a healthy weight is also discussed.  

## 2012-04-16 NOTE — Assessment & Plan Note (Signed)
Controlled, no change in medication  

## 2012-04-16 NOTE — Assessment & Plan Note (Signed)
Controlled though reported intermittent bouts of uncontrolled anxiety

## 2012-06-01 ENCOUNTER — Other Ambulatory Visit: Payer: Self-pay | Admitting: Family Medicine

## 2012-06-02 ENCOUNTER — Other Ambulatory Visit: Payer: Self-pay | Admitting: Family Medicine

## 2012-06-03 ENCOUNTER — Other Ambulatory Visit: Payer: Self-pay

## 2012-06-03 MED ORDER — PANTOPRAZOLE SODIUM 40 MG PO TBEC: 40.0000 mg | DELAYED_RELEASE_TABLET | Freq: Every day | ORAL | Status: DC

## 2012-06-29 ENCOUNTER — Other Ambulatory Visit: Payer: Self-pay | Admitting: Family Medicine

## 2012-07-23 ENCOUNTER — Other Ambulatory Visit: Payer: Self-pay | Admitting: Family Medicine

## 2012-08-03 LAB — COMPREHENSIVE METABOLIC PANEL
AST: 22 U/L (ref 0–37)
Albumin: 4.8 g/dL (ref 3.5–5.2)
Alkaline Phosphatase: 63 U/L (ref 39–117)
BUN: 15 mg/dL (ref 6–23)
Creat: 1.12 mg/dL — ABNORMAL HIGH (ref 0.50–1.10)
Glucose, Bld: 93 mg/dL (ref 70–99)
Total Bilirubin: 0.5 mg/dL (ref 0.3–1.2)

## 2012-08-03 LAB — LIPID PANEL
Cholesterol: 185 mg/dL (ref 0–200)
HDL: 53 mg/dL (ref >39)
Total CHOL/HDL Ratio: 3.5 Ratio
Triglycerides: 79 mg/dL (ref <150)

## 2012-08-03 LAB — CBC
HCT: 39 % (ref 36.0–46.0)
Hemoglobin: 13.5 g/dL (ref 12.0–15.0)
MCH: 31.5 pg (ref 26.0–34.0)
MCHC: 34.6 g/dL (ref 30.0–36.0)
RDW: 13.3 % (ref 11.5–15.5)

## 2012-08-10 ENCOUNTER — Ambulatory Visit (INDEPENDENT_AMBULATORY_CARE_PROVIDER_SITE_OTHER): Payer: Medicaid Other | Admitting: Family Medicine

## 2012-08-10 ENCOUNTER — Encounter: Payer: Self-pay | Admitting: Family Medicine

## 2012-08-10 VITALS — BP 128/82 | HR 92 | Resp 16 | Ht 64.0 in | Wt 147.4 lb

## 2012-08-10 DIAGNOSIS — K219 Gastro-esophageal reflux disease without esophagitis: Secondary | ICD-10-CM

## 2012-08-10 DIAGNOSIS — I10 Essential (primary) hypertension: Secondary | ICD-10-CM

## 2012-08-10 DIAGNOSIS — K5900 Constipation, unspecified: Secondary | ICD-10-CM

## 2012-08-10 DIAGNOSIS — E785 Hyperlipidemia, unspecified: Secondary | ICD-10-CM

## 2012-08-10 DIAGNOSIS — F411 Generalized anxiety disorder: Secondary | ICD-10-CM

## 2012-08-10 DIAGNOSIS — F29 Unspecified psychosis not due to a substance or known physiological condition: Secondary | ICD-10-CM

## 2012-08-10 DIAGNOSIS — E663 Overweight: Secondary | ICD-10-CM

## 2012-08-10 DIAGNOSIS — K5909 Other constipation: Secondary | ICD-10-CM

## 2012-08-10 NOTE — Assessment & Plan Note (Signed)
Deteriorated. Patient re-educated about  the importance of commitment to a  minimum of 150 minutes of exercise per week. The importance of healthy food choices with portion control discussed. Encouraged to start a food diary, count calories and to consider  joining a support group. Sample diet sheets offered. Goals set by the patient for the next several months.    

## 2012-08-10 NOTE — Assessment & Plan Note (Signed)
Controlled, no change in medication DASH diet and commitment to daily physical activity for a minimum of 30 minutes discussed and encouraged, as a part of hypertension management. The importance of attaining a healthy weight is also discussed.  

## 2012-08-10 NOTE — Assessment & Plan Note (Signed)
Controlled, no change in medication  

## 2012-08-10 NOTE — Assessment & Plan Note (Signed)
Improved, but lDL still elevated Hyperlipidemia:Low fat diet discussed and encouraged.

## 2012-08-10 NOTE — Patient Instructions (Addendum)
Pelvic and breast exam in early October, please call if you need me before  Blood work looks good, but you still need to reduce fried and fatty foods and red meat.  Increase water, fruit and vegetables.  It is important that you exercise regularly at least 30 minutes 5 times a week. If you develop chest pain, have severe difficulty breathing, or feel very tired, stop exercising immediately and seek medical attention

## 2012-08-10 NOTE — Assessment & Plan Note (Signed)
Managed by GI, GI to determine when to stop PPI

## 2012-08-10 NOTE — Progress Notes (Signed)
  Subjective:    Patient ID: Abigail Hopkins, female    DOB: Jan 10, 1961, 52 y.o.   MRN: [id number]  HPI The PT is here for follow up and re-evaluation of chronic medical conditions, medication management and review of any available recent lab and radiology data.  Preventive health is updated, specifically  Cancer screening and Immunization.   Questions or concerns regarding consultations or procedures which the PT has had in the interim are  addressed. The PT denies any adverse reactions to current medications since the last visit.  There are no new concerns.  There are no specific complaints       Review of Systems See HPI Denies recent fever or chills. Denies sinus pressure, nasal congestion, ear pain or sore throat. Denies chest congestion, productive cough or wheezing. Denies chest pains, palpitations and leg swelling Denies abdominal pain, nausea, vomiting,diarrhea or constipation.   Denies dysuria, frequency, hesitancy or incontinence. Denies joint pain, swelling and limitation in mobility. Denies headaches, seizures, numbness, or tingling. Denies depression, anxiety or insomnia. Denies skin break down or rash.        Objective:   Physical Exam Patient alert and oriented and in no cardiopulmonary distress.  HEENT: No facial asymmetry, EOMI, no sinus tenderness,  oropharynx pink and moist.  Neck supple no adenopathy.  Chest: Clear to auscultation bilaterally.  CVS: S1, S2 no murmurs, no S3.  ABD: Soft non tender. Bowel sounds normal.  Ext: No edema  MS: Adequate ROM spine, shoulders, hips and knees.  Skin: Intact, no ulcerations or rash noted.  Psych: Good eye contact, normal affect. Memory intact not anxious or depressed appearing.  CNS: CN 2-12 intact, power, tone and sensation normal throughout.        Assessment & Plan:

## 2012-08-24 ENCOUNTER — Other Ambulatory Visit: Payer: Self-pay | Admitting: Family Medicine

## 2012-08-25 ENCOUNTER — Other Ambulatory Visit: Payer: Self-pay | Admitting: Family Medicine

## 2012-09-24 ENCOUNTER — Other Ambulatory Visit: Payer: Self-pay | Admitting: Family Medicine

## 2012-10-28 ENCOUNTER — Other Ambulatory Visit: Payer: Self-pay | Admitting: Family Medicine

## 2012-11-27 ENCOUNTER — Other Ambulatory Visit: Payer: Self-pay | Admitting: Family Medicine

## 2012-12-25 ENCOUNTER — Other Ambulatory Visit: Payer: Self-pay | Admitting: Family Medicine

## 2013-01-11 ENCOUNTER — Other Ambulatory Visit: Payer: Self-pay

## 2013-01-11 DIAGNOSIS — K7581 Nonalcoholic steatohepatitis (NASH): Secondary | ICD-10-CM

## 2013-01-13 ENCOUNTER — Other Ambulatory Visit: Payer: Self-pay

## 2013-01-13 MED ORDER — PSYLLIUM 0.52 G PO CAPS: 0.5200 g | ORAL_CAPSULE | Freq: Two times a day (BID) | ORAL | Status: DC

## 2013-01-28 ENCOUNTER — Other Ambulatory Visit: Payer: Self-pay | Admitting: Family Medicine

## 2013-03-02 ENCOUNTER — Other Ambulatory Visit: Payer: Self-pay | Admitting: Family Medicine

## 2013-03-31 ENCOUNTER — Telehealth: Payer: Self-pay | Admitting: Family Medicine

## 2013-03-31 ENCOUNTER — Encounter: Payer: Medicaid Other | Admitting: Family Medicine

## 2013-03-31 NOTE — Telephone Encounter (Signed)
Noted, need to reschedule and keep appt!

## 2013-04-01 ENCOUNTER — Other Ambulatory Visit: Payer: Self-pay | Admitting: Family Medicine

## 2013-04-11 ENCOUNTER — Encounter (INDEPENDENT_AMBULATORY_CARE_PROVIDER_SITE_OTHER): Payer: Self-pay

## 2013-04-11 ENCOUNTER — Ambulatory Visit (INDEPENDENT_AMBULATORY_CARE_PROVIDER_SITE_OTHER): Payer: Medicaid Other | Admitting: Family Medicine

## 2013-04-11 ENCOUNTER — Encounter: Payer: Self-pay | Admitting: Family Medicine

## 2013-04-11 VITALS — BP 124/80 | HR 94 | Resp 16 | Ht 64.0 in | Wt 152.0 lb

## 2013-04-11 DIAGNOSIS — E785 Hyperlipidemia, unspecified: Secondary | ICD-10-CM

## 2013-04-11 DIAGNOSIS — I10 Essential (primary) hypertension: Secondary | ICD-10-CM

## 2013-04-11 DIAGNOSIS — Z113 Encounter for screening for infections with a predominantly sexual mode of transmission: Secondary | ICD-10-CM

## 2013-04-11 DIAGNOSIS — R5381 Other malaise: Secondary | ICD-10-CM

## 2013-04-11 DIAGNOSIS — Z23 Encounter for immunization: Secondary | ICD-10-CM

## 2013-04-11 DIAGNOSIS — R413 Other amnesia: Secondary | ICD-10-CM

## 2013-04-11 DIAGNOSIS — Z0000 Encounter for general adult medical examination without abnormal findings: Secondary | ICD-10-CM

## 2013-04-11 NOTE — Patient Instructions (Addendum)
F/u in early March, call if you need me before  Flu vaccine today  Fasting lipid, chem 7, TSH , cBc in March , before visit  Ms . Hairston you need to speak with social services  So that Vasiliki can have a health care power of attorney legally appointed for her, this is very important   Mammogram past due, please schedule and get this done

## 2013-04-17 DIAGNOSIS — Z0000 Encounter for general adult medical examination without abnormal findings: Secondary | ICD-10-CM | POA: Insufficient documentation

## 2013-04-17 NOTE — Progress Notes (Signed)
  Subjective:    Patient ID: Abigail Hopkins, female    DOB: September 29, 1960, 52 y.o.   MRN: [id number]  HPI The PT is here for annual exam and  re-evaluation of chronic medical conditions, medication management and review of any available recent lab and radiology data.  Preventive health is updated, specifically  Cancer screening and Immunization.   . The PT denies any adverse reactions to current medications since the last visit.  There are no new concerns.  There are no specific complaints       Review of Systems See HPI Denies recent fever or chills. Denies sinus pressure, nasal congestion, ear pain or sore throat. Denies chest congestion, productive cough or wheezing. Denies chest pains, palpitations and leg swelling Denies abdominal pain, nausea, vomiting,diarrhea or constipation.   Denies dysuria, frequency, hesitancy or incontinence. Denies joint pain, swelling and limitation in mobility. Denies headaches, seizures, numbness, or tingling. Denies uncontrolled  depression, anxiety or insomnia. Denies skin break down or rash.        Objective:   Physical Exam  Pleasant well nourished female, alert and oriented x 3, in no cardio-pulmonary distress. Afebrile. HEENT No facial trauma or asymetry. Sinuses non tender.  EOMI, PERTL, fundoscopic exam no hemorhage or exudate.  External ears normal, tympanic membranes clear. Oropharynx moist, no exudate, poor dentition. Neck: supple, no adenopathy,JVD or thyromegaly.No bruits.  Chest: Clear to ascultation bilaterally.No crackles or wheezes. Non tender to palpation  Breast: No asymetry,no masses. No nipple discharge or inversion. No axillary or supraclavicular adenopathy  Cardiovascular system; Heart sounds normal,  S1 and  S2 ,no S3.  No murmur, or thrill. Apical beat not displaced Peripheral pulses normal.  Abdomen: Soft, non tender, no organomegaly or masses. No bruits. Bowel sounds normal. No guarding, tenderness or  rebound.  Rectal:  No mass. Guaiac negative stool.  GU: External genitalia normal. No lesions. Vaginal canal normal.white  discharge. Uterus absent , no adnexal masses, no  adnexal tenderness.  Musculoskeletal exam: Full ROM of spine, hips , shoulders and knees. No deformity ,swelling or crepitus noted. No muscle wasting or atrophy.   Neurologic: Cranial nerves 2 to 12 intact. Power, tone ,sensation and reflexes normal throughout. No disturbance in gait. No tremor.  Skin: Intact, no ulceration, erythema , scaling or rash noted. Pigmentation normal throughout  Psych; Normal mood and affect.Impaired  Judgement and concentration      Assessment & Plan:

## 2013-04-17 NOTE — Assessment & Plan Note (Signed)
Pelvic and breast exam as documenred. Updated labs needed. Pt needs HCPOA Flu vaccine administered and mammogram to be scheduled ,past due

## 2013-04-20 ENCOUNTER — Other Ambulatory Visit: Payer: Self-pay | Admitting: Family Medicine

## 2013-04-20 DIAGNOSIS — Z139 Encounter for screening, unspecified: Secondary | ICD-10-CM

## 2013-04-29 ENCOUNTER — Ambulatory Visit (HOSPITAL_COMMUNITY)
Admission: RE | Admit: 2013-04-29 | Discharge: 2013-04-29 | Disposition: A | Discharge status: Home / Self Care | Payer: Medicaid Other | Source: Ambulatory Visit | Attending: Family Medicine | Admitting: Family Medicine

## 2013-04-29 DIAGNOSIS — Z139 Encounter for screening, unspecified: Secondary | ICD-10-CM

## 2013-04-29 DIAGNOSIS — Z1231 Encounter for screening mammogram for malignant neoplasm of breast: Secondary | ICD-10-CM | POA: Insufficient documentation

## 2013-05-02 ENCOUNTER — Other Ambulatory Visit: Payer: Self-pay | Admitting: Gastroenterology

## 2013-05-02 ENCOUNTER — Other Ambulatory Visit: Payer: Self-pay | Admitting: Family Medicine

## 2013-05-03 ENCOUNTER — Other Ambulatory Visit: Payer: Self-pay | Admitting: Family Medicine

## 2013-07-28 ENCOUNTER — Other Ambulatory Visit: Payer: Self-pay | Admitting: Family Medicine

## 2013-07-29 ENCOUNTER — Other Ambulatory Visit: Payer: Self-pay | Admitting: Family Medicine

## 2013-08-30 ENCOUNTER — Encounter (INDEPENDENT_AMBULATORY_CARE_PROVIDER_SITE_OTHER): Payer: Self-pay

## 2013-08-30 ENCOUNTER — Other Ambulatory Visit: Payer: Self-pay | Admitting: Family Medicine

## 2013-08-30 ENCOUNTER — Encounter: Payer: Self-pay | Admitting: Family Medicine

## 2013-08-30 ENCOUNTER — Ambulatory Visit (INDEPENDENT_AMBULATORY_CARE_PROVIDER_SITE_OTHER): Payer: Medicaid Other | Admitting: Family Medicine

## 2013-08-30 VITALS — BP 120/80 | HR 90 | Resp 16 | Ht 64.0 in | Wt 156.0 lb

## 2013-08-30 DIAGNOSIS — K5900 Constipation, unspecified: Secondary | ICD-10-CM

## 2013-08-30 DIAGNOSIS — M899 Disorder of bone, unspecified: Secondary | ICD-10-CM

## 2013-08-30 DIAGNOSIS — E785 Hyperlipidemia, unspecified: Secondary | ICD-10-CM

## 2013-08-30 DIAGNOSIS — I10 Essential (primary) hypertension: Secondary | ICD-10-CM

## 2013-08-30 DIAGNOSIS — E663 Overweight: Secondary | ICD-10-CM

## 2013-08-30 DIAGNOSIS — K5909 Other constipation: Secondary | ICD-10-CM

## 2013-08-30 DIAGNOSIS — K219 Gastro-esophageal reflux disease without esophagitis: Secondary | ICD-10-CM

## 2013-08-30 DIAGNOSIS — M949 Disorder of cartilage, unspecified: Secondary | ICD-10-CM

## 2013-08-30 DIAGNOSIS — F29 Unspecified psychosis not due to a substance or known physiological condition: Secondary | ICD-10-CM

## 2013-08-30 NOTE — Patient Instructions (Signed)
Pelvic and breast October 14 or after, call if you need me before  No changes in medication  Fasting cbc, lipid, chem 7, TSH and vit D this week if possible

## 2013-09-08 LAB — CBC WITH DIFFERENTIAL/PLATELET
BASOS PCT: 1 % (ref 0–1)
Basophils Absolute: 0.1 10*3/uL (ref 0.0–0.1)
Eosinophils Absolute: 0.1 10*3/uL (ref 0.0–0.7)
Eosinophils Relative: 1 % (ref 0–5)
HCT: 40.7 % (ref 36.0–46.0)
Hemoglobin: 13.7 g/dL (ref 12.0–15.0)
Lymphocytes Relative: 54 % — ABNORMAL HIGH (ref 12–46)
Lymphs Abs: 3 10*3/uL (ref 0.7–4.0)
MCH: 31.1 pg (ref 26.0–34.0)
MCHC: 33.7 g/dL (ref 30.0–36.0)
MCV: 92.3 fL (ref 78.0–100.0)
Monocytes Absolute: 0.2 10*3/uL (ref 0.1–1.0)
Monocytes Relative: 4 % (ref 3–12)
NEUTROS ABS: 2.2 10*3/uL (ref 1.7–7.7)
NEUTROS PCT: 40 % — AB (ref 43–77)
Platelets: 296 10*3/uL (ref 150–400)
RBC: 4.41 MIL/uL (ref 3.87–5.11)
RDW: 13.6 % (ref 11.5–15.5)
WBC: 5.6 10*3/uL (ref 4.0–10.5)

## 2013-09-09 LAB — BASIC METABOLIC PANEL
BUN: 10 mg/dL (ref 6–23)
CO2: 24 mEq/L (ref 19–32)
Calcium: 10.1 mg/dL (ref 8.4–10.5)
Chloride: 106 mEq/L (ref 96–112)
Creat: 1.06 mg/dL (ref 0.50–1.10)
GLUCOSE: 99 mg/dL (ref 70–99)
POTASSIUM: 4 meq/L (ref 3.5–5.3)
Sodium: 140 mEq/L (ref 135–145)

## 2013-09-09 LAB — TSH: TSH: 1.03 u[IU]/mL (ref 0.350–4.500)

## 2013-09-09 LAB — LIPID PANEL
CHOLESTEROL: 185 mg/dL (ref 0–200)
HDL: 56 mg/dL (ref >39)
LDL Cholesterol: 106 mg/dL — ABNORMAL HIGH (ref 0–99)
Total CHOL/HDL Ratio: 3.3 Ratio
Triglycerides: 114 mg/dL (ref <150)
VLDL: 23 mg/dL (ref 0–40)

## 2013-09-09 LAB — VITAMIN D 25 HYDROXY (VIT D DEFICIENCY, FRACTURES): VIT D 25 HYDROXY: 46 ng/mL (ref 30–89)

## 2013-09-26 ENCOUNTER — Other Ambulatory Visit: Payer: Self-pay | Admitting: Family Medicine

## 2013-10-30 NOTE — Assessment & Plan Note (Signed)
Improved :Low fat diet discussed and encouraged.  \

## 2013-10-30 NOTE — Assessment & Plan Note (Signed)
Controlled, no change in medication DASH diet and commitment to daily physical activity for a minimum of 30 minutes discussed and encouraged, as a part of hypertension management. The importance of attaining a healthy weight is also discussed.  

## 2013-10-30 NOTE — Assessment & Plan Note (Signed)
Controlled on fiber and stool softener daily, continue same

## 2013-10-30 NOTE — Assessment & Plan Note (Signed)
Controlled, no change in medication  

## 2013-10-30 NOTE — Progress Notes (Signed)
   Subjective:    Patient ID: Abigail Hopkins, female    DOB: 11/06/1960, 53 y.o.   MRN: [id number]  HPI The PT is here for follow up and re-evaluation of chronic medical conditions, medication management and review of any available recent lab and radiology data.  Preventive health is updated, specifically  Cancer screening and Immunization.   Questions or concerns regarding consultations or procedures which the PT has had in the interim are  addressed. The PT denies any adverse reactions to current medications since the last visit.  There are no new concerns.  There are no specific complaints       Review of Systems See HPI Denies recent fever or chills. Denies sinus pressure, nasal congestion, ear pain or sore throat. Denies chest congestion, productive cough or wheezing. Denies chest pains, palpitations and leg swelling Denies abdominal pain, nausea, vomiting,diarrhea or constipation.   Denies dysuria, frequency, hesitancy or incontinence. Denies joint pain, swelling and limitation in mobility. Denies headaches, seizures, numbness, or tingling. Denies depression, anxiety or insomnia. Denies skin break down or rash.        Objective:   Physical Exam BP 120/80  Pulse 90  Resp 16  Ht 5\' 4"  (1.626 m)  Wt 156 lb (70.761 kg)  BMI 26.76 kg/m2  SpO2 99% Patient alert and oriented and in no cardiopulmonary distress.  HEENT: No facial asymmetry, EOMI, no sinus tenderness,  oropharynx pink and moist.  Neck supple no adenopathy.  Chest: Clear to auscultation bilaterally.  CVS: S1, S2 no murmurs, no S3.  ABD: Soft non tender. Bowel sounds normal.  Ext: No edema  MS: Adequate ROM spine, shoulders, hips and knees.  Skin: Intact, no ulcerations or rash noted.  Psych: Good eye contact, normal affect. Memory impaired, pt has mild MR not anxious or depressed appearing.  CNS: CN 2-12 intact, power, tone and sensation normal throughout.        Assessment & Plan:    ESSENTIAL HYPERTENSION, BENIGN /Controlled, no change in medication DASH diet and commitment to daily physical activity for a minimum of 30 minutes discussed and encouraged, as a part of hypertension management. The importance of attaining a healthy weight is also discussed.   DYSLIPIDEMIA Improved :Low fat diet discussed and encouraged.  \  UNSPECIFIED PSYCHOSIS Stable and controlled on current meds, continue same  OVERWEIGHT Deteriorated. Patient re-educated about  the importance of commitment to a  minimum of 150 minutes of exercise per week. The importance of healthy food choices with portion control discussed. Encouraged to start a food diary, count calories and to consider  joining a support group. Sample diet sheets offered. Goals set by the patient for the next several months.     GERD Controlled, no change in medication   Chronic constipation Controlled on fiber and stool softener daily, continue same

## 2013-10-30 NOTE — Assessment & Plan Note (Addendum)
Deteriorated. Patient re-educated about  the importance of commitment to a  minimum of 150 minutes of exercise per week. The importance of healthy food choices with portion control discussed. Encouraged to start a food diary, count calories and to consider  joining a support group. Sample diet sheets offered. Goals set by the patient for the next several months.    

## 2013-10-30 NOTE — Assessment & Plan Note (Signed)
Stable and controlled on current meds, continue same

## 2013-10-31 ENCOUNTER — Other Ambulatory Visit: Payer: Self-pay | Admitting: Family Medicine

## 2013-11-29 ENCOUNTER — Other Ambulatory Visit: Payer: Self-pay | Admitting: Gastroenterology

## 2013-11-29 ENCOUNTER — Other Ambulatory Visit: Payer: Self-pay | Admitting: Family Medicine

## 2013-12-02 ENCOUNTER — Other Ambulatory Visit: Payer: Self-pay | Admitting: Family Medicine

## 2013-12-23 ENCOUNTER — Other Ambulatory Visit: Payer: Self-pay | Admitting: Family Medicine

## 2014-01-27 ENCOUNTER — Other Ambulatory Visit: Payer: Self-pay | Admitting: Family Medicine

## 2014-01-30 ENCOUNTER — Other Ambulatory Visit: Payer: Self-pay | Admitting: Family Medicine

## 2014-02-27 ENCOUNTER — Other Ambulatory Visit: Payer: Self-pay | Admitting: Family Medicine

## 2014-02-27 ENCOUNTER — Other Ambulatory Visit: Payer: Self-pay | Admitting: Gastroenterology

## 2014-03-27 ENCOUNTER — Other Ambulatory Visit: Payer: Self-pay | Admitting: Family Medicine

## 2014-03-27 DIAGNOSIS — Z1231 Encounter for screening mammogram for malignant neoplasm of breast: Secondary | ICD-10-CM

## 2014-03-28 ENCOUNTER — Other Ambulatory Visit: Payer: Self-pay | Admitting: Family Medicine

## 2014-05-01 ENCOUNTER — Ambulatory Visit (HOSPITAL_COMMUNITY)
Admission: RE | Admit: 2014-05-01 | Discharge: 2014-05-01 | Disposition: A | Discharge status: Home / Self Care | Payer: Medicaid Other | Source: Ambulatory Visit | Attending: Family Medicine | Admitting: Family Medicine

## 2014-05-01 ENCOUNTER — Other Ambulatory Visit: Payer: Self-pay | Admitting: Family Medicine

## 2014-05-01 DIAGNOSIS — Z1231 Encounter for screening mammogram for malignant neoplasm of breast: Secondary | ICD-10-CM | POA: Diagnosis present

## 2014-06-20 ENCOUNTER — Ambulatory Visit (INDEPENDENT_AMBULATORY_CARE_PROVIDER_SITE_OTHER): Payer: Medicaid Other

## 2014-06-20 ENCOUNTER — Other Ambulatory Visit (HOSPITAL_COMMUNITY)
Admission: RE | Admit: 2014-06-20 | Discharge: 2014-06-20 | Disposition: A | Discharge status: Home / Self Care | Payer: Medicaid Other | Source: Ambulatory Visit | Attending: Family Medicine | Admitting: Family Medicine

## 2014-06-20 ENCOUNTER — Encounter: Payer: Self-pay | Admitting: Family Medicine

## 2014-06-20 ENCOUNTER — Ambulatory Visit (INDEPENDENT_AMBULATORY_CARE_PROVIDER_SITE_OTHER): Payer: Medicaid Other | Admitting: Family Medicine

## 2014-06-20 ENCOUNTER — Encounter (INDEPENDENT_AMBULATORY_CARE_PROVIDER_SITE_OTHER): Payer: Self-pay

## 2014-06-20 VITALS — BP 114/72 | HR 89 | Resp 16 | Ht 64.0 in | Wt 156.1 lb

## 2014-06-20 DIAGNOSIS — Z23 Encounter for immunization: Secondary | ICD-10-CM

## 2014-06-20 DIAGNOSIS — Z01419 Encounter for gynecological examination (general) (routine) without abnormal findings: Secondary | ICD-10-CM | POA: Diagnosis present

## 2014-06-20 DIAGNOSIS — Z0000 Encounter for general adult medical examination without abnormal findings: Secondary | ICD-10-CM | POA: Insufficient documentation

## 2014-06-20 DIAGNOSIS — Z1211 Encounter for screening for malignant neoplasm of colon: Secondary | ICD-10-CM

## 2014-06-20 DIAGNOSIS — I10 Essential (primary) hypertension: Secondary | ICD-10-CM

## 2014-06-20 DIAGNOSIS — Z1151 Encounter for screening for human papillomavirus (HPV): Secondary | ICD-10-CM | POA: Diagnosis present

## 2014-06-20 DIAGNOSIS — Z124 Encounter for screening for malignant neoplasm of cervix: Secondary | ICD-10-CM

## 2014-06-20 DIAGNOSIS — E785 Hyperlipidemia, unspecified: Secondary | ICD-10-CM

## 2014-06-20 LAB — HEMOCCULT GUIAC POC 1CARD (OFFICE): FECAL OCCULT BLD: NEGATIVE

## 2014-06-20 NOTE — Patient Instructions (Addendum)
F/u in 5.5 month, call if you need me before  Flu vaccine today  Exam is good, except see eye Doc re red left eye  Fasting CBC, lipid, chem 7 , TSH in 5.5 month  Use wax softener 3 to 5 days before next visit

## 2014-06-20 NOTE — Assessment & Plan Note (Signed)

## 2014-06-21 LAB — CYTOLOGY - PAP

## 2014-06-26 ENCOUNTER — Other Ambulatory Visit: Payer: Self-pay | Admitting: Family Medicine

## 2014-06-26 NOTE — Progress Notes (Signed)
   Subjective:    Patient ID: Abigail Hopkins, female    DOB: 22-Aug-1960, 53 y.o.   MRN: [id number]  HPI Patient is in for annual physical  exam. Flu vaccine ius administered  Review of Systems See HPI     Objective:   Physical Exam  BP 114/72 mmHg  Pulse 89  Resp 16  Ht 5' 4"  (1.626 m)  Wt 156 lb 1.9 oz (70.816 kg)  BMI 26.78 kg/m2  SpO2 97% Pleasant well nourished female, alert and oriented x 3, in no cardio-pulmonary distress. Afebrile. HEENT No facial trauma or asymetry. Sinuses non tender.  Extra occullar muscles intact, pupils equally reactive to light. External ears normal, tympanic membranes occluded on left, clear on right. Oropharynx moist, no exudate,poor dentition Neck: supple, no adenopathy,JVD or thyromegaly.No bruits.  Chest: Clear to ascultation bilaterally.No crackles or wheezes. Non tender to palpation  Breast: No asymetry,no masses or lumps. No tenderness. No nipple discharge or inversion. No axillary or supraclavicular adenopathy  Cardiovascular system; Heart sounds normal,  S1 and  S2 ,no S3.  No murmur, or thrill. Apical beat not displaced Peripheral pulses normal.  Abdomen: Soft, non tender, no organomegaly or masses. No bruits. Bowel sounds normal. No guarding, tenderness or rebound.  Rectal:  Normal sphincter tone. No mass.No rectal masses.  Guaiac negative stool.  GU: External genitalia normal female genitalia , female distribution of hair. No lesions. Urethral meatus normal in size, no  Prolapse, no lesions visibly  Present. Bladder non tender. Vagina pink and moist , with no visible lesions ,physiologic  discharge present . Adequate pelvic support no  cystocele or rectocele noted Cervix pink and appears healthy, no lesions or ulcerations noted, no discharge noted from os Uterus absent, no adnexal masses, no  adnexal tenderness.   Musculoskeletal exam: Full ROM of spine, hips , shoulders and knees. No deformity ,swelling or  crepitus noted. No muscle wasting or atrophy.   Neurologic: Cranial nerves 2 to 12 intact. Power, tone ,sensation and reflexes normal throughout. No disturbance in gait. No tremor.  Skin: Intact, no ulceration, erythema , scaling or rash noted. Pigmentation normal throughout  Psych; Blunted mood and affect.        Assessment & Plan:  Encounter for annual physical exam Annual exam as documented. Counseling done  re healthy lifestyle involving commitment to 150 minutes exercise per week, heart healthy diet, and attaining healthy weight.The importance of adequate sleep also discussed. Regular seat belt use and home safety, is also discussed. Changes in health habits are decided on by the patient with goals and time frames  set for achieving them. Immunization and cancer screening needs are specifically addressed at this visit.      Need for prophylactic vaccination and inoculation against influenza Vaccine administered at visit.

## 2014-06-26 NOTE — Assessment & Plan Note (Signed)
Vaccine administered at visit.  

## 2014-08-01 ENCOUNTER — Other Ambulatory Visit: Payer: Self-pay | Admitting: Family Medicine

## 2014-08-30 ENCOUNTER — Other Ambulatory Visit: Payer: Self-pay | Admitting: Gastroenterology

## 2014-10-31 ENCOUNTER — Other Ambulatory Visit: Payer: Self-pay | Admitting: Family Medicine

## 2014-12-06 ENCOUNTER — Ambulatory Visit: Payer: Medicaid Other | Admitting: Family Medicine

## 2014-12-14 ENCOUNTER — Ambulatory Visit (INDEPENDENT_AMBULATORY_CARE_PROVIDER_SITE_OTHER): Payer: Medicaid Other | Admitting: Family Medicine

## 2014-12-14 ENCOUNTER — Encounter: Payer: Self-pay | Admitting: Family Medicine

## 2014-12-14 VITALS — BP 128/82 | HR 80 | Resp 18 | Ht 62.0 in | Wt 159.0 lb

## 2014-12-14 DIAGNOSIS — F411 Generalized anxiety disorder: Secondary | ICD-10-CM

## 2014-12-14 DIAGNOSIS — K5900 Constipation, unspecified: Secondary | ICD-10-CM

## 2014-12-14 DIAGNOSIS — K7581 Nonalcoholic steatohepatitis (NASH): Secondary | ICD-10-CM

## 2014-12-14 DIAGNOSIS — K219 Gastro-esophageal reflux disease without esophagitis: Secondary | ICD-10-CM

## 2014-12-14 DIAGNOSIS — E663 Overweight: Secondary | ICD-10-CM

## 2014-12-14 DIAGNOSIS — K5909 Other constipation: Secondary | ICD-10-CM

## 2014-12-14 DIAGNOSIS — I10 Essential (primary) hypertension: Secondary | ICD-10-CM | POA: Diagnosis not present

## 2014-12-14 DIAGNOSIS — R21 Rash and other nonspecific skin eruption: Secondary | ICD-10-CM

## 2014-12-14 DIAGNOSIS — E785 Hyperlipidemia, unspecified: Secondary | ICD-10-CM

## 2014-12-14 NOTE — Patient Instructions (Signed)
Annual wellness in  5 months  Labs within the next 1 week please  Antibiotic ointment provided twice daily for 5 days to rash  Commit to walking 30 mins every day and eating more vegetable for improved health please

## 2014-12-14 NOTE — Progress Notes (Signed)
Subjective:    Patient ID: Abigail Hopkins, female    DOB: Oct 19, 1960, 54 y.o.   MRN: [id number]  HPI The PT is here for follow up and re-evaluation of chronic medical conditions, medication management and review of any available recent lab and radiology data.  Preventive health is updated, specifically  Cancer screening and Immunization.   Questions or concerns regarding consultations or procedures which the PT has had in the interim are  addressed. The PT denies any adverse reactions to current medications since the last visit.  1 week h/o rash on forearms    Review of Systems See HPI Denies recent fever or chills. Denies sinus pressure, nasal congestion, ear pain or sore throat. Denies chest congestion, productive cough or wheezing. Denies chest pains, palpitations and leg swelling Denies abdominal pain, nausea, vomiting,diarrhea or constipation.   Denies dysuria, frequency, hesitancy or incontinence. Denies joint pain, swelling and limitation in mobility. Denies headaches, seizures, numbness, or tingling. Denies uncontrolled  depression, anxiety or insomnia.        Objective:   Physical Exam BP 128/82 mmHg  Pulse 80  Resp 18  Ht 5' 2"  (1.575 m)  Wt 159 lb 0.6 oz (72.14 kg)  BMI 29.08 kg/m2  SpO2 99%  Patient alert and oriented and in no cardiopulmonary distress.  HEENT: No facial asymmetry, EOMI,   oropharynx pink and moist.  Neck supple no JVD, no mass.  Chest: Clear to auscultation bilaterally.  CVS: S1, S2 no murmurs, no S3.Regular rate.  ABD: Soft non tender.   Ext: No edema  MS: Adequate ROM spine, shoulders, hips and knees.  Skin: rash noted on forearm  Psych: Good eye contact, normal affect. Memory intact not anxious or depressed appearing.  CNS: CN 2-12 intact, power,  normal throughout.no focal deficits noted.        Assessment & Plan:  ESSENTIAL HYPERTENSION, BENIGN Controlled, no change in medication DASH diet and commitment to daily  physical activity for a minimum of 30 minutes discussed and encouraged, as a part of hypertension management. The importance of attaining a healthy weight is also discussed.  BP/Weight 12/14/2014 06/20/2014 08/30/2013 04/11/2013 08/10/2012 04/15/2012 3/70/4888  Systolic BP 916 945 038 882 800 349 179  Diastolic BP 82 72 80 80 82 80 83  Wt. (Lbs) 159.04 156.12 156 152 147.4 144.04 141  BMI 29.08 26.78 26.76 26.08 25.29 24.71 27.54        GERD Controlled, no change in medication   NASH (nonalcoholic steatohepatitis) Weight loss advised and encouraged  Chronic constipation Controlled, no change in medication   Dyslipidemia Improved but still not at goal Hyperlipidemia:Low fat diet discussed and encouraged.   Lipid Panel  Lab Results  Component Value Date   CHOL 169 12/18/2014   HDL 53 12/18/2014   LDLCALC 100* 12/18/2014   TRIG 79 12/18/2014   CHOLHDL 3.2 12/18/2014   Updated lab needed at/ before next visit.      Overweight Deteriorated. Patient re-educated about  the importance of commitment to a  minimum of 150 minutes of exercise per week.  The importance of healthy food choices with portion control discussed. Encouraged to start a food diary, count calories and to consider  joining a support group. Sample diet sheets offered. Goals set by the patient for the next several months.   Weight /BMI 12/14/2014 06/20/2014 08/30/2013  WEIGHT 159 lb 0.6 oz 156 lb 1.9 oz 156 lb  HEIGHT 5' 2"  5' 4"  5' 4"   BMI 29.08 kg/m2  26.78 kg/m2 26.76 kg/m2    Current exercise per week 60 minutes.   Psychosis Controlled, no change in medication   Anxiety state Controlled, no change in medication   Rash and nonspecific skin eruption Antibiotic ointment given for twice daily use for 5 days

## 2014-12-18 LAB — CBC
HEMATOCRIT: 39.2 % (ref 36.0–46.0)
Hemoglobin: 13.1 g/dL (ref 12.0–15.0)
MCH: 30.8 pg (ref 26.0–34.0)
MCHC: 33.4 g/dL (ref 30.0–36.0)
MCV: 92.2 fL (ref 78.0–100.0)
MPV: 9.8 fL (ref 8.6–12.4)
Platelets: 264 10*3/uL (ref 150–400)
RBC: 4.25 MIL/uL (ref 3.87–5.11)
RDW: 13.2 % (ref 11.5–15.5)
WBC: 4.9 10*3/uL (ref 4.0–10.5)

## 2014-12-19 LAB — TSH: TSH: 0.594 u[IU]/mL (ref 0.350–4.500)

## 2014-12-19 LAB — BASIC METABOLIC PANEL
BUN: 18 mg/dL (ref 6–23)
CO2: 24 meq/L (ref 19–32)
Calcium: 9.9 mg/dL (ref 8.4–10.5)
Chloride: 110 mEq/L (ref 96–112)
Creat: 1.11 mg/dL — ABNORMAL HIGH (ref 0.50–1.10)
Glucose, Bld: 96 mg/dL (ref 70–99)
POTASSIUM: 4.1 meq/L (ref 3.5–5.3)
Sodium: 144 mEq/L (ref 135–145)

## 2014-12-19 LAB — LIPID PANEL
Cholesterol: 169 mg/dL (ref 0–200)
HDL: 53 mg/dL (ref >=46)
LDL CALC: 100 mg/dL — AB (ref 0–99)
Total CHOL/HDL Ratio: 3.2 Ratio
Triglycerides: 79 mg/dL (ref <150)
VLDL: 16 mg/dL (ref 0–40)

## 2014-12-31 DIAGNOSIS — R21 Rash and other nonspecific skin eruption: Secondary | ICD-10-CM | POA: Insufficient documentation

## 2014-12-31 NOTE — Assessment & Plan Note (Signed)
Controlled, no change in medication DASH diet and commitment to daily physical activity for a minimum of 30 minutes discussed and encouraged, as a part of hypertension management. The importance of attaining a healthy weight is also discussed.  BP/Weight 12/14/2014 06/20/2014 08/30/2013 04/11/2013 08/10/2012 04/15/2012 0/86/5784  Systolic BP 696 295 284 132 440 102 725  Diastolic BP 82 72 80 80 82 80 83  Wt. (Lbs) 159.04 156.12 156 152 147.4 144.04 141  BMI 29.08 26.78 26.76 26.08 25.29 24.71 27.54

## 2014-12-31 NOTE — Assessment & Plan Note (Signed)
Deteriorated. Patient re-educated about  the importance of commitment to a  minimum of 150 minutes of exercise per week.  The importance of healthy food choices with portion control discussed. Encouraged to start a food diary, count calories and to consider  joining a support group. Sample diet sheets offered. Goals set by the patient for the next several months.   Weight /BMI 12/14/2014 06/20/2014 08/30/2013  WEIGHT 159 lb 0.6 oz 156 lb 1.9 oz 156 lb  HEIGHT 5\' 2"  5\' 4"  5\' 4"   BMI 29.08 kg/m2 26.78 kg/m2 26.76 kg/m2    Current exercise per week 60 minutes.

## 2014-12-31 NOTE — Assessment & Plan Note (Signed)
Antibiotic ointment given for twice daily use for 5 days

## 2014-12-31 NOTE — Assessment & Plan Note (Signed)
Controlled, no change in medication  

## 2014-12-31 NOTE — Assessment & Plan Note (Signed)
Improved but still not at goal Hyperlipidemia:Low fat diet discussed and encouraged.   Lipid Panel  Lab Results  Component Value Date   CHOL 169 12/18/2014   HDL 53 12/18/2014   LDLCALC 100* 12/18/2014   TRIG 79 12/18/2014   CHOLHDL 3.2 12/18/2014   Updated lab needed at/ before next visit.

## 2014-12-31 NOTE — Assessment & Plan Note (Signed)
Weight loss advised and encouraged

## 2015-01-24 ENCOUNTER — Other Ambulatory Visit: Payer: Self-pay | Admitting: Family Medicine

## 2015-02-22 ENCOUNTER — Other Ambulatory Visit: Payer: Self-pay | Admitting: Family Medicine

## 2015-02-22 ENCOUNTER — Other Ambulatory Visit: Payer: Self-pay | Admitting: Gastroenterology

## 2015-03-27 ENCOUNTER — Other Ambulatory Visit: Payer: Self-pay | Admitting: Family Medicine

## 2015-04-02 ENCOUNTER — Other Ambulatory Visit: Payer: Self-pay | Admitting: Family Medicine

## 2015-04-02 DIAGNOSIS — Z1231 Encounter for screening mammogram for malignant neoplasm of breast: Secondary | ICD-10-CM

## 2015-04-26 ENCOUNTER — Other Ambulatory Visit: Payer: Self-pay | Admitting: Family Medicine

## 2015-05-03 ENCOUNTER — Ambulatory Visit (HOSPITAL_COMMUNITY)
Admission: RE | Admit: 2015-05-03 | Discharge: 2015-05-03 | Disposition: A | Discharge status: Home / Self Care | Payer: Medicaid Other | Source: Ambulatory Visit | Attending: Family Medicine | Admitting: Family Medicine

## 2015-05-03 DIAGNOSIS — Z1231 Encounter for screening mammogram for malignant neoplasm of breast: Secondary | ICD-10-CM | POA: Diagnosis present

## 2015-05-10 ENCOUNTER — Ambulatory Visit (INDEPENDENT_AMBULATORY_CARE_PROVIDER_SITE_OTHER): Payer: Medicaid Other

## 2015-05-10 DIAGNOSIS — Z23 Encounter for immunization: Secondary | ICD-10-CM

## 2015-05-30 ENCOUNTER — Other Ambulatory Visit: Payer: Self-pay | Admitting: Family Medicine

## 2015-06-04 ENCOUNTER — Encounter: Payer: Self-pay | Admitting: Family Medicine

## 2015-06-04 ENCOUNTER — Ambulatory Visit (INDEPENDENT_AMBULATORY_CARE_PROVIDER_SITE_OTHER): Payer: Medicaid Other | Admitting: Family Medicine

## 2015-06-04 VITALS — BP 122/82 | HR 67 | Resp 16 | Ht 62.0 in | Wt 150.0 lb

## 2015-06-04 DIAGNOSIS — F29 Unspecified psychosis not due to a substance or known physiological condition: Secondary | ICD-10-CM | POA: Diagnosis not present

## 2015-06-04 DIAGNOSIS — I10 Essential (primary) hypertension: Secondary | ICD-10-CM | POA: Diagnosis not present

## 2015-06-04 DIAGNOSIS — Z1159 Encounter for screening for other viral diseases: Secondary | ICD-10-CM | POA: Diagnosis not present

## 2015-06-04 DIAGNOSIS — E663 Overweight: Secondary | ICD-10-CM

## 2015-06-04 DIAGNOSIS — K219 Gastro-esophageal reflux disease without esophagitis: Secondary | ICD-10-CM

## 2015-06-04 DIAGNOSIS — K5909 Other constipation: Secondary | ICD-10-CM

## 2015-06-04 DIAGNOSIS — E785 Hyperlipidemia, unspecified: Secondary | ICD-10-CM

## 2015-06-04 DIAGNOSIS — K5900 Constipation, unspecified: Secondary | ICD-10-CM

## 2015-06-04 DIAGNOSIS — F411 Generalized anxiety disorder: Secondary | ICD-10-CM

## 2015-06-04 NOTE — Assessment & Plan Note (Signed)
Controlled, no change in medication  

## 2015-06-04 NOTE — Assessment & Plan Note (Signed)
Stable and controlled on current medication, continue same

## 2015-06-04 NOTE — Progress Notes (Signed)
   Subjective:    Patient ID: Abigail Hopkins, female    DOB: 12/07/1960, 54 y.o.   MRN: [id number]  HPI   Abigail Hopkins     MRN: [id number]      DOB: 06/22/1961   HPI [name] is here for follow up and re-evaluation of chronic medical conditions, medication management and review of any available recent lab and radiology data.  Preventive health is updated, specifically  Cancer screening and Immunization.   Questions or concerns regarding consultations or procedures which the PT has had in the interim are  addressed. The PT denies any adverse reactions to current medications since the last visit.  There are no new concerns.  There are no specific complaints   ROS Denies recent fever or chills. Denies sinus pressure, nasal congestion, ear pain or sore throat. Denies chest congestion, productive cough or wheezing. Denies chest pains, palpitations and leg swelling Denies abdominal pain, nausea, vomiting,diarrhea or constipation.   Denies dysuria, frequency, hesitancy or incontinence. Denies joint pain, swelling and limitation in mobility. Denies headaches, seizures, numbness, or tingling. Denies depression, anxiety or insomnia. Denies skin break down or rash.   PE  BP 122/82 mmHg  Pulse 67  Resp 16  Ht 5\' 2"  (1.575 m)  Wt 150 lb (68.04 kg)  BMI 27.43 kg/m2  SpO2 97%  Patient alert and oriented and in no cardiopulmonary distress.  HEENT: No facial asymmetry, EOMI,   oropharynx pink and moist.  Neck supple no JVD, no mass.  Chest: Clear to auscultation bilaterally.  CVS: S1, S2 no murmurs, no S3.Regular rate.  ABD: Soft non tender.   Ext: No edema  MS: Adequate ROM spine, shoulders, hips and knees.  Skin: Intact, no ulcerations or rash noted.  Psych: Good eye contact, normal affect. Memory intact not anxious or depressed appearing.  CNS: CN 2-12 intact, power,  normal throughout.no focal deficits noted.   Assessment & Plan  ESSENTIAL HYPERTENSION,  BENIGN Controlled, no change in medication DASH diet and commitment to daily physical activity for a minimum of 30 minutes discussed and encouraged, as a part of hypertension management. The importance of attaining a healthy weight is also discussed.  BP/Weight 06/04/2015 12/14/2014 06/20/2014 08/30/2013 04/11/2013 08/10/2012 04/15/2012  Systolic BP 122 128 114 120 124 128 130  Diastolic BP 82 82 72 80 80 82 80  Wt. (Lbs) 150 159.04 156.12 156 152 147.4 144.04  BMI 27.43 29.08 26.78 26.76 26.08 25.29 24.71        Psychosis Stable and controlled on current medication, continue same  Overweight Improved. Pt applauded on succesful weight loss through lifestyle change, and encouraged to continue same. Weight loss goal set for the next several months.   Dyslipidemia Hyperlipidemia:Low fat diet discussed and encouraged.   Lipid Panel  Lab Results  Component Value Date   CHOL 169 12/18/2014   HDL 53 12/18/2014   LDLCALC 100* 12/18/2014   TRIG 79 12/18/2014   CHOLHDL 3.2 12/18/2014   Updated lab needed at/ before next visit.      GERD Controlled, no change in medication   Chronic constipation Controlled, no change in medication   Anxiety state Controlled, no change in medication        Review of Systems     Objective:   Physical Exam        Assessment & Plan:

## 2015-06-04 NOTE — Patient Instructions (Addendum)
Annual physical exam in 5 month, call if you need me sooner  Congrats on better eating and weight loss  Fasting chem 7 , HIV and  Hep C in 5 month , prior to visit  Thanks for choosing [facility], we consider it a privelige to serve you.   All the best for 2017!

## 2015-06-04 NOTE — Assessment & Plan Note (Signed)
Hyperlipidemia:Low fat diet discussed and encouraged.   Lipid Panel  Lab Results  Component Value Date   CHOL 169 12/18/2014   HDL 53 12/18/2014   LDLCALC 100* 12/18/2014   TRIG 79 12/18/2014   CHOLHDL 3.2 12/18/2014   Updated lab needed at/ before next visit.

## 2015-06-04 NOTE — Assessment & Plan Note (Signed)
Controlled, no change in medication DASH diet and commitment to daily physical activity for a minimum of 30 minutes discussed and encouraged, as a part of hypertension management. The importance of attaining a healthy weight is also discussed.  BP/Weight 06/04/2015 12/14/2014 06/20/2014 08/30/2013 04/11/2013 08/10/2012 00/37/9444  Systolic BP 619 012 224 114 643 142 767  Diastolic BP 82 82 72 80 80 82 80  Wt. (Lbs) 150 159.04 156.12 156 152 147.4 144.04  BMI 27.43 29.08 26.78 26.76 26.08 25.29 24.71

## 2015-06-04 NOTE — Assessment & Plan Note (Signed)
Improved. Pt applauded on succesful weight loss through lifestyle change, and encouraged to continue same. Weight loss goal set for the next several months.  

## 2015-06-26 ENCOUNTER — Other Ambulatory Visit: Payer: Self-pay | Admitting: Gastroenterology

## 2015-07-02 ENCOUNTER — Emergency Department (HOSPITAL_COMMUNITY)
Admission: EM | Admit: 2015-07-02 | Discharge: 2015-07-02 | Disposition: A | Discharge status: Home / Self Care | Payer: Medicaid Other | Attending: Emergency Medicine | Admitting: Emergency Medicine

## 2015-07-02 ENCOUNTER — Emergency Department (HOSPITAL_COMMUNITY): Payer: Medicaid Other

## 2015-07-02 ENCOUNTER — Encounter (HOSPITAL_COMMUNITY): Payer: Self-pay | Admitting: *Deleted

## 2015-07-02 DIAGNOSIS — Z87891 Personal history of nicotine dependence: Secondary | ICD-10-CM | POA: Diagnosis not present

## 2015-07-02 DIAGNOSIS — K219 Gastro-esophageal reflux disease without esophagitis: Secondary | ICD-10-CM | POA: Insufficient documentation

## 2015-07-02 DIAGNOSIS — I10 Essential (primary) hypertension: Secondary | ICD-10-CM | POA: Diagnosis not present

## 2015-07-02 DIAGNOSIS — R1013 Epigastric pain: Secondary | ICD-10-CM | POA: Diagnosis present

## 2015-07-02 DIAGNOSIS — Z9049 Acquired absence of other specified parts of digestive tract: Secondary | ICD-10-CM | POA: Diagnosis not present

## 2015-07-02 DIAGNOSIS — F419 Anxiety disorder, unspecified: Secondary | ICD-10-CM | POA: Insufficient documentation

## 2015-07-02 DIAGNOSIS — Z90710 Acquired absence of both cervix and uterus: Secondary | ICD-10-CM | POA: Insufficient documentation

## 2015-07-02 DIAGNOSIS — Z79899 Other long term (current) drug therapy: Secondary | ICD-10-CM | POA: Insufficient documentation

## 2015-07-02 DIAGNOSIS — K5900 Constipation, unspecified: Secondary | ICD-10-CM | POA: Insufficient documentation

## 2015-07-02 DIAGNOSIS — R197 Diarrhea, unspecified: Secondary | ICD-10-CM | POA: Diagnosis not present

## 2015-07-02 DIAGNOSIS — E669 Obesity, unspecified: Secondary | ICD-10-CM | POA: Diagnosis not present

## 2015-07-02 LAB — CBC WITH DIFFERENTIAL/PLATELET
Basophils Absolute: 0 10*3/uL (ref 0.0–0.1)
Basophils Relative: 0 %
Eosinophils Absolute: 0.3 10*3/uL (ref 0.0–0.7)
Eosinophils Relative: 4 %
HEMATOCRIT: 40.5 % (ref 36.0–46.0)
HEMOGLOBIN: 13.9 g/dL (ref 12.0–15.0)
LYMPHS ABS: 2.9 10*3/uL (ref 0.7–4.0)
Lymphocytes Relative: 47 %
MCH: 31.7 pg (ref 26.0–34.0)
MCHC: 34.3 g/dL (ref 30.0–36.0)
MCV: 92.3 fL (ref 78.0–100.0)
MONO ABS: 0.3 10*3/uL (ref 0.1–1.0)
MONOS PCT: 5 %
NEUTROS ABS: 2.7 10*3/uL (ref 1.7–7.7)
NEUTROS PCT: 44 %
Platelets: 260 10*3/uL (ref 150–400)
RBC: 4.39 MIL/uL (ref 3.87–5.11)
RDW: 12.6 % (ref 11.5–15.5)
WBC: 6.3 10*3/uL (ref 4.0–10.5)

## 2015-07-02 LAB — COMPREHENSIVE METABOLIC PANEL
ALK PHOS: 68 U/L (ref 38–126)
ALT: 42 U/L (ref 14–54)
ANION GAP: 9 (ref 5–15)
AST: 28 U/L (ref 15–41)
Albumin: 4.7 g/dL (ref 3.5–5.0)
BILIRUBIN TOTAL: 0.6 mg/dL (ref 0.3–1.2)
BUN: 15 mg/dL (ref 6–20)
CALCIUM: 9.7 mg/dL (ref 8.9–10.3)
CO2: 25 mmol/L (ref 22–32)
Chloride: 107 mmol/L (ref 101–111)
Creatinine, Ser: 1 mg/dL (ref 0.44–1.00)
GLUCOSE: 115 mg/dL — AB (ref 65–99)
POTASSIUM: 3.4 mmol/L — AB (ref 3.5–5.1)
Sodium: 141 mmol/L (ref 135–145)
TOTAL PROTEIN: 7.6 g/dL (ref 6.5–8.1)

## 2015-07-02 LAB — LIPASE, BLOOD: Lipase: 45 U/L (ref 11–51)

## 2015-07-02 MED ORDER — SODIUM CHLORIDE 0.9 % IV BOLUS (SEPSIS)
250.0000 mL | Freq: Once | INTRAVENOUS | Status: AC
  Administered 2015-07-02: 250 mL via INTRAVENOUS

## 2015-07-02 MED ORDER — TRAMADOL HCL 50 MG PO TABS: 50.0000 mg | ORAL_TABLET | Freq: Four times a day (QID) | ORAL | Status: DC | PRN

## 2015-07-02 MED ORDER — IOHEXOL 300 MG/ML  SOLN
100.0000 mL | Freq: Once | INTRAMUSCULAR | Status: AC | PRN
  Administered 2015-07-02: 100 mL via INTRAVENOUS

## 2015-07-02 MED ORDER — SODIUM CHLORIDE 0.9 % IV SOLN
INTRAVENOUS | Status: DC
  Administered 2015-07-02: 14:00:00 via INTRAVENOUS

## 2015-07-02 MED ORDER — ONDANSETRON HCL 4 MG/2ML IJ SOLN
4.0000 mg | Freq: Once | INTRAMUSCULAR | Status: AC
  Administered 2015-07-02: 4 mg via INTRAVENOUS
  Filled 2015-07-02: qty 2

## 2015-07-02 MED ORDER — FENTANYL CITRATE (PF) 100 MCG/2ML IJ SOLN
50.0000 ug | Freq: Once | INTRAMUSCULAR | Status: AC
  Administered 2015-07-02: 50 ug via INTRAVENOUS
  Filled 2015-07-02: qty 2

## 2015-07-02 NOTE — ED Notes (Addendum)
Patient reports abd pain since yesterday. Denies n/v. Reports diarrhea x 2. Pt. From [facility].

## 2015-07-02 NOTE — ED Provider Notes (Signed)
CSN: [id number]     Arrival date & time 07/02/15  1035 History   First MD Initiated Contact with Patient 07/02/15 1304     Chief Complaint  Patient presents with  . Abdominal Pain     (Consider location/radiation/quality/duration/timing/severity/associated sxs/prior Treatment) Patient is a 55 y.o. female presenting with abdominal pain. The history is provided by the patient and a relative.  Abdominal Pain Associated symptoms: diarrhea   Associated symptoms: no chest pain, no dysuria, no fever, no nausea, no shortness of breath and no vomiting    the patient from a family care facility. With a complaint of epigastric abdominal pain since yesterday. No nausea no vomiting but 2 episodes of diarrhea. No blood in the bowel movements. Patient denied any pain with urination fevers or any pain in the back. Patient states that the epigastric abdominal pain isn't a candidate 6 out of 10.   Past Medical History  Diagnosis Date  . HTN (hypertension)   . Psychotic disorder   . Anxiety   . Obesity     mild  . GI problem     h/o SB bacterial overgrowth  . Constipation   . GERD (gastroesophageal reflux disease)   . NASH (nonalcoholic steatohepatitis)    Past Surgical History  Procedure Laterality Date  . Cholecystectomy    . Complete hysterectomy    . Abdominal surgery    . Colonoscopy  06/2008    normal TI, small hermorrhoids  . Esophagogastroduodenoscopy  10/2007    chronic, mild gastritis, no H.Pylori  . Hydrogen breath test  07/12/2008    Hydrogen level consistent with small bowel bacterial  overgrowth  . Abdominal hysterectomy     History reviewed. No pertinent family history. Social History  Substance Use Topics  . Smoking status: Former Smoker -- 1.00 packs/day    Types: Cigarettes  . Smokeless tobacco: None  . Alcohol Use: No   OB History    No data available     Review of Systems  Constitutional: Negative for fever.  HENT: Negative for congestion.   Eyes: Negative for  redness.  Respiratory: Negative for shortness of breath.   Cardiovascular: Negative for chest pain.  Gastrointestinal: Positive for abdominal pain and diarrhea. Negative for nausea and vomiting.  Genitourinary: Negative for dysuria.  Musculoskeletal: Negative for back pain.  Skin: Negative for rash.  Neurological: Negative for headaches.  Hematological: Does not bruise/bleed easily.  Psychiatric/Behavioral: Negative for confusion.      Allergies  Review of patient's allergies indicates no known allergies.  Home Medications   Prior to Admission medications   Medication Sig Start Date End Date Taking? Authorizing Provider  clonazePAM (KLONOPIN) 0.5 MG tablet TAKE (1) TABLET BY MOUTH TWICE DAILY. 06/04/15  Yes Fayrene Helper, MD  hydrochlorothiazide (MICROZIDE) 12.5 MG capsule TAKE (1) CAPSULE BY MOUTH ONCE DAILY. 06/04/15  Yes Fayrene Helper, MD  lisinopril (PRINIVIL,ZESTRIL) 20 MG tablet TAKE ONE TABLET BY MOUTH DAILY. 03/29/15  Yes Fayrene Helper, MD  OLANZapine (ZYPREXA) 7.5 MG tablet TAKE (1) TABLET BY MOUTH AT BEDTIME. 06/04/15  Yes Fayrene Helper, MD  OYSTER CALCIUM 500 + D 500-200 MG-UNIT per tablet TAKE (1) TABLET BY MOUTH TWICE DAILY. 08/02/14  Yes Fayrene Helper, MD  pantoprazole (PROTONIX) 40 MG tablet TAKE ONE TABLET BY MOUTH ONCE DAILY. 06/27/15  Yes Orvil Feil, NP  psyllium (REGULOID) 0.52 G capsule TAKE (1) CAPSULE BY MOUTH TWICE DAILY. 08/02/14  Yes Fayrene Helper, MD  STOOL SOFTENER  100 MG capsule TAKE (1) CAPSULE BY MOUTH TWICE DAILY. 04/27/15  Yes Fayrene Helper, MD  topiramate (TOPAMAX) 100 MG tablet TAKE ONE TABLET BY MOUTH ONCE DAILY. 06/04/15  Yes Fayrene Helper, MD  travoprost, benzalkonium, (TRAVATAN) 0.004 % ophthalmic solution 1 drop at bedtime.    Yes Historical Provider, MD  traMADol (ULTRAM) 50 MG tablet Take 1 tablet (50 mg total) by mouth every 6 (six) hours as needed. 07/02/15   Fredia Sorrow, MD   BP 129/80 mmHg  Pulse 64   Temp(Src) 97.8 F (36.6 C) (Oral)  Resp 18  Ht 5' (1.524 m)  Wt 73.029 kg  BMI 31.44 kg/m2  SpO2 99% Physical Exam  Constitutional: She appears well-developed and well-nourished. No distress.  HENT:  Head: Normocephalic.  Mouth/Throat: Oropharynx is clear and moist.  Eyes: Conjunctivae and EOM are normal. Pupils are equal, round, and reactive to light.  Neck: Normal range of motion.  Cardiovascular: Normal rate, regular rhythm and normal heart sounds.   No murmur heard. Pulmonary/Chest: Effort normal and breath sounds normal. No respiratory distress.  Abdominal: Soft. Bowel sounds are normal. There is no tenderness.  Musculoskeletal: Normal range of motion. She exhibits no edema.  Neurological: She is alert. No cranial nerve deficit. She exhibits normal muscle tone. Coordination normal.  Skin: Skin is warm. No rash noted.  Nursing note and vitals reviewed.   ED Course  Procedures (including critical care time) Labs Review Labs Reviewed  COMPREHENSIVE METABOLIC PANEL - Abnormal; Notable for the following:    Potassium 3.4 (*)    Glucose, Bld 115 (*)    All other components within normal limits  CBC WITH DIFFERENTIAL/PLATELET  LIPASE, BLOOD   Results for orders placed or performed during the hospital encounter of 07/02/15  CBC WITH DIFFERENTIAL  Result Value Ref Range   WBC 6.3 4.0 - 10.5 K/uL   RBC 4.39 3.87 - 5.11 MIL/uL   Hemoglobin 13.9 12.0 - 15.0 g/dL   HCT 40.5 36.0 - 46.0 %   MCV 92.3 78.0 - 100.0 fL   MCH 31.7 26.0 - 34.0 pg   MCHC 34.3 30.0 - 36.0 g/dL   RDW 12.6 11.5 - 15.5 %   Platelets 260 150 - 400 K/uL   Neutrophils Relative % 44 %   Neutro Abs 2.7 1.7 - 7.7 K/uL   Lymphocytes Relative 47 %   Lymphs Abs 2.9 0.7 - 4.0 K/uL   Monocytes Relative 5 %   Monocytes Absolute 0.3 0.1 - 1.0 K/uL   Eosinophils Relative 4 %   Eosinophils Absolute 0.3 0.0 - 0.7 K/uL   Basophils Relative 0 %   Basophils Absolute 0.0 0.0 - 0.1 K/uL  Comprehensive metabolic panel   Result Value Ref Range   Sodium 141 135 - 145 mmol/L   Potassium 3.4 (L) 3.5 - 5.1 mmol/L   Chloride 107 101 - 111 mmol/L   CO2 25 22 - 32 mmol/L   Glucose, Bld 115 (H) 65 - 99 mg/dL   BUN 15 6 - 20 mg/dL   Creatinine, Ser 1.00 0.44 - 1.00 mg/dL   Calcium 9.7 8.9 - 10.3 mg/dL   Total Protein 7.6 6.5 - 8.1 g/dL   Albumin 4.7 3.5 - 5.0 g/dL   AST 28 15 - 41 U/L   ALT 42 14 - 54 U/L   Alkaline Phosphatase 68 38 - 126 U/L   Total Bilirubin 0.6 0.3 - 1.2 mg/dL   GFR calc non Af Amer >60 >60 mL/min  GFR calc Af Amer >60 >60 mL/min   Anion gap 9 5 - 15  Lipase, blood  Result Value Ref Range   Lipase 45 11 - 51 U/L     Imaging Review Ct Abdomen Pelvis W Contrast  07/02/2015  CLINICAL DATA:  Patient with diffuse abdominal pain diarrhea. History of reflux disease. EXAM: CT ABDOMEN AND PELVIS WITH CONTRAST TECHNIQUE: Multidetector CT imaging of the abdomen and pelvis was performed using the standard protocol following bolus administration of intravenous contrast. CONTRAST:  122m OMNIPAQUE IOHEXOL 300 MG/ML  SOLN COMPARISON:  CT abdomen pelvis 11/17/2007. FINDINGS: Lower chest: Normal heart size. Lung bases are clear. No pleural effusion. Hepatobiliary: Liver is normal in size and contour. No focal lesion identified. Patient status post cholecystectomy. Pancreas: Unremarkable Spleen: Unremarkable Adrenals/Urinary Tract: The adrenal glands are normal. Kidneys enhance symmetrically with contrast. No hydronephrosis. Urinary bladder is unremarkable. Stomach/Bowel: The appendix is normal. No abnormal bowel wall thickening or evidence bowel obstruction. Normal morphology to the stomach. Vascular/Lymphatic: Normal caliber abdominal aorta. Circumaortic left renal vein. No retroperitoneal lymphadenopathy. Other: Status post hysterectomy. Musculoskeletal: Lumbar spine degenerative changes. No aggressive or acute appearing osseous lesions. IMPRESSION: No acute process within abdomen or pelvis. Electronically  Signed   By: DLovey NewcomerM.D.   On: 07/02/2015 14:56   I have personally reviewed and evaluated these images and lab results as part of my medical decision-making.   EKG Interpretation None      MDM   Final diagnoses:  Epigastric pain    Extensive workup for the abdominal pain in the epigastric area without any acute findings. Labs with no abnormalities CT scan negative. Patient already on Protonix. No significant abnormalities with the vital signs. Will treat symptomatically a patient continue her Protonix and will treat with tramadol. We'll have patient return for any new or worse symptoms.  SFredia Sorrow MD 07/02/15 1526

## 2015-07-02 NOTE — Discharge Instructions (Signed)
Workup for the epigastric abdominal pain without any acute findings. Continue the medication Protonix. Take out the tramadol as needed for discomfort. Make an appointment to follow-up with her regular doctor. Return for any new or worse symptoms.

## 2015-07-30 ENCOUNTER — Other Ambulatory Visit: Payer: Self-pay | Admitting: Family Medicine

## 2015-08-28 ENCOUNTER — Other Ambulatory Visit: Payer: Self-pay | Admitting: Family Medicine

## 2015-09-28 ENCOUNTER — Other Ambulatory Visit: Payer: Self-pay

## 2015-09-28 MED ORDER — CLONAZEPAM 0.5 MG PO TABS: ORAL_TABLET | ORAL | Status: DC

## 2015-10-02 LAB — BASIC METABOLIC PANEL
BUN: 17 mg/dL (ref 7–25)
CHLORIDE: 108 mmol/L (ref 98–110)
CO2: 24 mmol/L (ref 20–31)
Calcium: 10.2 mg/dL (ref 8.6–10.4)
Creat: 1.17 mg/dL — ABNORMAL HIGH (ref 0.50–1.05)
GLUCOSE: 99 mg/dL (ref 65–99)
POTASSIUM: 3.8 mmol/L (ref 3.5–5.3)
SODIUM: 143 mmol/L (ref 135–146)

## 2015-10-02 LAB — HEPATITIS C ANTIBODY: HCV Ab: NEGATIVE

## 2015-10-02 LAB — HIV ANTIBODY (ROUTINE TESTING W REFLEX): HIV: NONREACTIVE

## 2015-11-05 ENCOUNTER — Other Ambulatory Visit (HOSPITAL_COMMUNITY)
Admission: RE | Admit: 2015-11-05 | Discharge: 2015-11-05 | Disposition: A | Discharge status: Home / Self Care | Payer: Medicaid Other | Source: Ambulatory Visit | Attending: Family Medicine | Admitting: Family Medicine

## 2015-11-05 ENCOUNTER — Encounter: Payer: Self-pay | Admitting: Family Medicine

## 2015-11-05 ENCOUNTER — Ambulatory Visit (INDEPENDENT_AMBULATORY_CARE_PROVIDER_SITE_OTHER): Payer: Medicaid Other | Admitting: Family Medicine

## 2015-11-05 VITALS — BP 122/80 | HR 78 | Resp 16 | Ht 60.0 in | Wt 145.0 lb

## 2015-11-05 DIAGNOSIS — Z01419 Encounter for gynecological examination (general) (routine) without abnormal findings: Secondary | ICD-10-CM | POA: Diagnosis present

## 2015-11-05 DIAGNOSIS — Z124 Encounter for screening for malignant neoplasm of cervix: Secondary | ICD-10-CM

## 2015-11-05 DIAGNOSIS — Z1211 Encounter for screening for malignant neoplasm of colon: Secondary | ICD-10-CM | POA: Diagnosis not present

## 2015-11-05 DIAGNOSIS — I10 Essential (primary) hypertension: Secondary | ICD-10-CM

## 2015-11-05 DIAGNOSIS — Z0000 Encounter for general adult medical examination without abnormal findings: Secondary | ICD-10-CM | POA: Diagnosis not present

## 2015-11-05 LAB — POC HEMOCCULT BLD/STL (OFFICE/1-CARD/DIAGNOSTIC): Fecal Occult Blood, POC: NEGATIVE

## 2015-11-05 NOTE — Patient Instructions (Signed)
F/u in 5 months.  Fasting chem 7 and TSH in 5 month  Congrats on improved health  Please work on good  health habits so that your health will improve. 1. Commitment to daily physical activity for 30 to 60  minutes, if you are able to do this.  2. Commitment to wise food choices. Aim for half of your  food intake to be vegetable and fruit, one quarter starchy foods, and one quarter protein. Try to eat on a regular schedule  3 meals per day, snacking between meals should be limited to vegetables or fruits or small portions of nuts. 64 ounces of water per day is generally recommended, unless you have specific health conditions, like heart failure or kidney failure where you will need to limit fluid intake.  3. Commitment to sufficient and a  good quality of physical and mental rest daily, generally between 6 to 8 hours per day.  WITH PERSISTANCE AND PERSEVERANCE, THE IMPOSSIBLE , BECOMES THE NORM!   Thank you  for choosing [facility]. We consider it a privelige to serve you.  Delivering excellent health care in a caring and  compassionate way is our goal.  Partnering with you,  so that together we can achieve this goal is our strategy.

## 2015-11-05 NOTE — Assessment & Plan Note (Signed)

## 2015-11-06 LAB — CYTOLOGY - PAP

## 2015-11-06 NOTE — Progress Notes (Signed)
   Subjective:    Patient ID: Abigail NiemannBrenda S Gayton, female    DOB: 11/26/1960, 55 y.o.   MRN: [id number]  HPI Patient is in for annual physical exam. No other health concerns are expressed or addressed at the visit. Recent labs, if available are reviewed. Immunization is reviewed , and  updated if needed.    Review of Systems See HPI     Objective:   Physical Exam  BP 122/80 mmHg  Pulse 78  Resp 16  Ht 5' (1.524 m)  Wt 145 lb (65.772 kg)  BMI 28.32 kg/m2  SpO2 98%  Pleasant well nourished female, alert and oriented x 3, in no cardio-pulmonary distress. Afebrile. HEENT No facial trauma or asymetry. Sinuses non tender.  Extra occullar muscles intact, pupils equally reactive to light. External ears normal, tympanic membranes clear. Oropharynx moist, no exudate, dentures Neck: supple, no adenopathy,JVD or thyromegaly.No bruits.  Chest: Clear to ascultation bilaterally.No crackles or wheezes. Non tender to palpation  Breast: No asymetry,no masses or lumps. No tenderness. No nipple discharge or inversion. No axillary or supraclavicular adenopathy  Cardiovascular system; Heart sounds normal,  S1 and  S2 ,no S3.  No murmur, or thrill. Apical beat not displaced Peripheral pulses normal.  Abdomen: Soft, non tender, no organomegaly or masses. No bruits. Bowel sounds normal. No guarding, tenderness or rebound.  Rectal:  Normal sphincter tone. No mass.No rectal masses.  Guaiac negative stool.  GU: External genitalia normal female genitalia , female distribution of hair. No lesions. Urethral meatus normal in size, no  Prolapse, no lesions visibly  Present. Bladder non tender. Vagina pink and moist , with no visible lesions , discharge present . Adequate pelvic support no  cystocele or rectocele noted Cervix absent Uterus absent, no adnexal tenderness.No adnexal tenderness   Musculoskeletal exam: Full ROM of spine, hips , shoulders and knees. No deformity ,swelling or  crepitus noted. No muscle wasting or atrophy.   Neurologic: Cranial nerves 2 to 12 intact. Power, tone ,sensation and reflexes normal throughout. No disturbance in gait. No tremor.  Skin: Intact, no ulceration, erythema , scaling or rash noted. Pigmentation normal throughout  Psych; Normal mood and affect. Judgement and concentration normal       Assessment & Plan:  Annual physical exam Annual exam as documented. Counseling done  re healthy lifestyle involving commitment to 150 minutes exercise per week, heart healthy diet, and attaining healthy weight.The importance of adequate sleep also discussed. Regular seat belt use and home safety, is also discussed. Changes in health habits are decided on by the patient with goals and time frames  set for achieving them. Immunization and cancer screening needs are specifically addressed at this visit.

## 2015-11-28 ENCOUNTER — Other Ambulatory Visit: Payer: Self-pay | Admitting: Family Medicine

## 2015-12-28 ENCOUNTER — Other Ambulatory Visit: Payer: Self-pay | Admitting: Gastroenterology

## 2015-12-28 ENCOUNTER — Other Ambulatory Visit: Payer: Self-pay | Admitting: Family Medicine

## 2015-12-28 NOTE — Telephone Encounter (Signed)
We haven't seen the patient since 2013 and she'll need a follow-up OV for further refills. I'll send in 1-2 months worth to get her until she can be seen. Otherwise can follow-up with PCP

## 2015-12-31 ENCOUNTER — Other Ambulatory Visit: Payer: Self-pay

## 2015-12-31 MED ORDER — LISINOPRIL 20 MG PO TABS: 20.0000 mg | ORAL_TABLET | Freq: Every day | ORAL | Status: DC

## 2015-12-31 MED ORDER — CLONAZEPAM 0.5 MG PO TABS: ORAL_TABLET | ORAL | Status: DC

## 2015-12-31 NOTE — Telephone Encounter (Signed)
Pt's caregiver is aware and has made appt for appt for 02/07/2016 at 11:00 Am with Wynne DustEric Gill, NP.

## 2016-01-22 ENCOUNTER — Other Ambulatory Visit: Payer: Self-pay | Admitting: Family Medicine

## 2016-02-07 ENCOUNTER — Ambulatory Visit: Payer: Medicaid Other | Admitting: Nurse Practitioner

## 2016-02-26 ENCOUNTER — Other Ambulatory Visit: Payer: Self-pay | Admitting: Gastroenterology

## 2016-03-25 ENCOUNTER — Other Ambulatory Visit: Payer: Self-pay | Admitting: Gastroenterology

## 2016-04-07 ENCOUNTER — Ambulatory Visit: Payer: Medicaid Other | Admitting: Family Medicine

## 2016-04-16 LAB — BASIC METABOLIC PANEL
BUN: 18 mg/dL (ref 7–25)
CALCIUM: 9.8 mg/dL (ref 8.6–10.4)
CHLORIDE: 104 mmol/L (ref 98–110)
CO2: 25 mmol/L (ref 20–31)
CREATININE: 1.12 mg/dL — AB (ref 0.50–1.05)
Glucose, Bld: 96 mg/dL (ref 65–99)
Potassium: 3.8 mmol/L (ref 3.5–5.3)
SODIUM: 141 mmol/L (ref 135–146)

## 2016-04-16 LAB — TSH: TSH: 0.75 mIU/L

## 2016-04-17 ENCOUNTER — Ambulatory Visit: Payer: Medicaid Other | Admitting: Family Medicine

## 2016-04-25 ENCOUNTER — Other Ambulatory Visit: Payer: Self-pay | Admitting: Family Medicine

## 2016-04-29 ENCOUNTER — Other Ambulatory Visit: Payer: Self-pay

## 2016-04-29 MED ORDER — CLONAZEPAM 0.5 MG PO TABS: ORAL_TABLET | ORAL | 0 refills | Status: DC

## 2016-05-26 ENCOUNTER — Other Ambulatory Visit: Payer: Self-pay | Admitting: Family Medicine

## 2016-06-27 ENCOUNTER — Other Ambulatory Visit: Payer: Self-pay

## 2016-06-27 MED ORDER — CLONAZEPAM 0.5 MG PO TABS: ORAL_TABLET | ORAL | 0 refills | Status: DC

## 2016-07-25 ENCOUNTER — Other Ambulatory Visit: Payer: Self-pay | Admitting: Family Medicine

## 2016-07-31 ENCOUNTER — Encounter: Payer: Self-pay | Admitting: Family Medicine

## 2016-07-31 ENCOUNTER — Ambulatory Visit (INDEPENDENT_AMBULATORY_CARE_PROVIDER_SITE_OTHER): Payer: Medicaid Other | Admitting: Family Medicine

## 2016-07-31 VITALS — BP 118/82 | HR 68 | Resp 14 | Ht 60.0 in | Wt 152.0 lb

## 2016-07-31 DIAGNOSIS — M949 Disorder of cartilage, unspecified: Secondary | ICD-10-CM

## 2016-07-31 DIAGNOSIS — E785 Hyperlipidemia, unspecified: Secondary | ICD-10-CM | POA: Diagnosis not present

## 2016-07-31 DIAGNOSIS — Z23 Encounter for immunization: Secondary | ICD-10-CM | POA: Diagnosis not present

## 2016-07-31 DIAGNOSIS — K5909 Other constipation: Secondary | ICD-10-CM

## 2016-07-31 DIAGNOSIS — M899 Disorder of bone, unspecified: Secondary | ICD-10-CM

## 2016-07-31 DIAGNOSIS — E663 Overweight: Secondary | ICD-10-CM

## 2016-07-31 DIAGNOSIS — I10 Essential (primary) hypertension: Secondary | ICD-10-CM

## 2016-07-31 DIAGNOSIS — Z1231 Encounter for screening mammogram for malignant neoplasm of breast: Secondary | ICD-10-CM

## 2016-07-31 DIAGNOSIS — Z1239 Encounter for other screening for malignant neoplasm of breast: Secondary | ICD-10-CM

## 2016-07-31 DIAGNOSIS — F29 Unspecified psychosis not due to a substance or known physiological condition: Secondary | ICD-10-CM

## 2016-07-31 DIAGNOSIS — K219 Gastro-esophageal reflux disease without esophagitis: Secondary | ICD-10-CM

## 2016-07-31 DIAGNOSIS — F411 Generalized anxiety disorder: Secondary | ICD-10-CM

## 2016-07-31 MED ORDER — CLONAZEPAM 0.5 MG PO TABS: ORAL_TABLET | ORAL | 5 refills | Status: DC

## 2016-07-31 NOTE — Patient Instructions (Addendum)
Physical exam in September, call if you need me before  Flu vaccine today  Fasting cBC, lipid, chem 7, TSH and Vit D in 5 months  Keep active, and eat smaller portions, you want to keep healthy  You are referred for mammogram  Thank you  for choosing [facility]. We consider it a privelige to serve you.  Delivering excellent health care in a caring and  compassionate way is our goal.  Partnering with you,  so that together we can achieve this goal is our strategy.

## 2016-08-01 NOTE — Assessment & Plan Note (Signed)
Controlled, no change in medication  

## 2016-08-01 NOTE — Assessment & Plan Note (Signed)
Deteriorated. Patient re-educated about  the importance of commitment to a  minimum of 150 minutes of exercise per week.  The importance of healthy food choices with portion control discussed. Encouraged to start a food diary, count calories and to consider  joining a support group. Sample diet sheets offered. Goals set by the patient for the next several months.   Weight /BMI 07/31/2016 11/05/2015 07/02/2015  WEIGHT 152 lb 145 lb 161 lb  HEIGHT 5' 0"  5' 0"  5' 0"   BMI 29.69 kg/m2 28.32 kg/m2 31.44 kg/m2

## 2016-08-01 NOTE — Assessment & Plan Note (Signed)
Controlled, no change in medication DASH diet and commitment to daily physical activity for a minimum of 30 minutes discussed and encouraged, as a part of hypertension management. The importance of attaining a healthy weight is also discussed.  BP/Weight 07/31/2016 11/05/2015 07/02/2015 06/04/2015 12/14/2014 76/70/1100 08/31/9609  Systolic BP 643 539 122 583 462 194 712  Diastolic BP 82 80 80 82 82 72 80  Wt. (Lbs) 152 145 161 150 159.04 156.12 156  BMI 29.69 28.32 31.44 27.43 29.08 26.78 26.76

## 2016-08-01 NOTE — Assessment & Plan Note (Signed)
Hyperlipidemia:Low fat diet discussed and encouraged.   Lipid Panel  Lab Results  Component Value Date   CHOL 169 12/18/2014   HDL 53 12/18/2014   LDLCALC 100 (H) 12/18/2014   TRIG 79 12/18/2014   CHOLHDL 3.2 12/18/2014   Updated lab needed at/ before next visit.

## 2016-08-01 NOTE — Assessment & Plan Note (Signed)
After obtaining informed consent, the vaccine is  administered by LPN.  

## 2016-08-01 NOTE — Progress Notes (Signed)
Abigail Hopkins     MRN: [id number]      DOB: 1960/08/15   HPI Abigail Hopkins is here for follow up and re-evaluation of chronic medical conditions, medication management and review of any available recent lab and radiology data.  Preventive health is updated, specifically  Cancer screening and Immunization.   Questions or concerns regarding consultations or procedures which the PT has had in the interim are  addressed. The PT denies any adverse reactions to current medications since the last visit.    ROS Denies recent fever or chills. Denies sinus pressure, nasal congestion, ear pain or sore throat. Denies chest congestion, productive cough or wheezing. Denies chest pains, palpitations and leg swelling Denies abdominal pain, nausea, vomiting,diarrhea or constipation.   Denies dysuria, frequency, hesitancy or incontinence. C/o occsional left knee pain and stiffness , rated  between a 3 and 4, denies buckling or near fallsDenies headaches, seizures, numbness, or tingling. Denies depression, anxiety or insomnia. Denies skin break down or rash.   PE  BP 118/82   Pulse 68   Resp 14   Ht 5' (1.524 m)   Wt 152 lb (68.9 kg)   SpO2 97%   BMI 29.69 kg/m   Patient alert and oriented and in no cardiopulmonary distress.  HEENT: No facial asymmetry, EOMI,   oropharynx pink and moist.  Neck supple no JVD, no mass.  Chest: Clear to auscultation bilaterally.  CVS: S1, S2 no murmurs, no S3.Regular rate.  ABD: Soft non tender.   Ext: No edema  MS: Adequate ROM spine, shoulders, hips and knees.  Skin: Intact, no ulcerations or rash noted.  Psych: Good eye contact, normal affect. Memory intact not anxious or depressed appearing.  CNS: CN 2-12 intact, power,  normal throughout.no focal deficits noted.   Assessment & Plan  ESSENTIAL HYPERTENSION, BENIGN Controlled, no change in medication DASH diet and commitment to daily physical activity for a minimum of 30 minutes discussed and  encouraged, as a part of hypertension management. The importance of attaining a healthy weight is also discussed.  BP/Weight 07/31/2016 11/05/2015 07/02/2015 06/04/2015 12/14/2014 03/83/3383 08/08/1914  Systolic BP 606 004 599 774 142 395 320  Diastolic BP 82 80 80 82 82 72 80  Wt. (Lbs) 152 145 161 150 159.04 156.12 156  BMI 29.69 28.32 31.44 27.43 29.08 26.78 26.76       Overweight Deteriorated. Patient re-educated about  the importance of commitment to a  minimum of 150 minutes of exercise per week.  The importance of healthy food choices with portion control discussed. Encouraged to start a food diary, count calories and to consider  joining a support group. Sample diet sheets offered. Goals set by the patient for the next several months.   Weight /BMI 07/31/2016 11/05/2015 07/02/2015  WEIGHT 152 lb 145 lb 161 lb  HEIGHT 5' 0"  5' 0"  5' 0"   BMI 29.69 kg/m2 28.32 kg/m2 31.44 kg/m2      Psychosis Controlled, no change in medication   GERD Controlled, no change in medication   Dyslipidemia Hyperlipidemia:Low fat diet discussed and encouraged.   Lipid Panel  Lab Results  Component Value Date   CHOL 169 12/18/2014   HDL 53 12/18/2014   LDLCALC 100 (H) 12/18/2014   TRIG 79 12/18/2014   CHOLHDL 3.2 12/18/2014   Updated lab needed at/ before next visit.     Chronic constipation Controlled, no change in medication   Anxiety state Controlled, no change in medication   Need for  immunization against influenza After obtaining informed consent, the vaccine is  administered by LPN.

## 2016-08-27 ENCOUNTER — Other Ambulatory Visit: Payer: Self-pay | Admitting: Family Medicine

## 2016-12-24 ENCOUNTER — Telehealth: Payer: Self-pay

## 2016-12-24 ENCOUNTER — Ambulatory Visit: Payer: Medicaid Other

## 2016-12-24 ENCOUNTER — Ambulatory Visit (HOSPITAL_COMMUNITY)
Admission: RE | Admit: 2016-12-24 | Discharge: 2016-12-24 | Disposition: A | Discharge status: Home / Self Care | Payer: Medicaid Other | Source: Ambulatory Visit | Attending: Family Medicine | Admitting: Family Medicine

## 2016-12-24 ENCOUNTER — Other Ambulatory Visit: Payer: Self-pay | Admitting: Family Medicine

## 2016-12-24 DIAGNOSIS — Z111 Encounter for screening for respiratory tuberculosis: Secondary | ICD-10-CM | POA: Diagnosis present

## 2016-12-24 NOTE — Telephone Encounter (Signed)
Order entered [facility] aware

## 2016-12-24 NOTE — Telephone Encounter (Signed)
Abigail Hopkins brought in Abigail Hopkins this am and she is requesting an order for chest xray for PPD screening because she is allergic to the PPD skin test. I made her aware that it was not listed as an allergy but I would add it to her record and see if you would order the chest xray. She is waiting in the lobby for an answer.

## 2016-12-25 ENCOUNTER — Other Ambulatory Visit: Payer: Self-pay | Admitting: Family Medicine

## 2016-12-25 LAB — CBC WITH DIFFERENTIAL/PLATELET
BASOS PCT: 0 %
Basophils Absolute: 0 cells/uL (ref 0–200)
EOS PCT: 1 %
Eosinophils Absolute: 44 cells/uL (ref 15–500)
HCT: 40.7 % (ref 35.0–45.0)
Hemoglobin: 13.4 g/dL (ref 11.7–15.5)
LYMPHS PCT: 59 %
Lymphs Abs: 2596 cells/uL (ref 850–3900)
MCH: 31.3 pg (ref 27.0–33.0)
MCHC: 32.9 g/dL (ref 32.0–36.0)
MCV: 95.1 fL (ref 80.0–100.0)
MONOS PCT: 4 %
MPV: 9.7 fL (ref 7.5–12.5)
Monocytes Absolute: 176 cells/uL — ABNORMAL LOW (ref 200–950)
NEUTROS ABS: 1584 {cells}/uL (ref 1500–7800)
Neutrophils Relative %: 36 %
PLATELETS: 277 10*3/uL (ref 140–400)
RBC: 4.28 MIL/uL (ref 3.80–5.10)
RDW: 13.7 % (ref 11.0–15.0)
WBC: 4.4 10*3/uL (ref 3.8–10.8)

## 2016-12-26 LAB — BASIC METABOLIC PANEL
BUN: 14 mg/dL (ref 7–25)
CHLORIDE: 106 mmol/L (ref 98–110)
CO2: 23 mmol/L (ref 20–31)
Calcium: 9.8 mg/dL (ref 8.6–10.4)
Creat: 1.09 mg/dL — ABNORMAL HIGH (ref 0.50–1.05)
Glucose, Bld: 100 mg/dL — ABNORMAL HIGH (ref 65–99)
Potassium: 3.9 mmol/L (ref 3.5–5.3)
SODIUM: 138 mmol/L (ref 135–146)

## 2016-12-26 LAB — LIPID PANEL
CHOLESTEROL: 185 mg/dL (ref <200)
HDL: 58 mg/dL (ref >50)
LDL CALC: 116 mg/dL — AB (ref <100)
Total CHOL/HDL Ratio: 3.2 Ratio (ref <5.0)
Triglycerides: 57 mg/dL (ref <150)
VLDL: 11 mg/dL (ref <30)

## 2016-12-26 LAB — VITAMIN D 25 HYDROXY (VIT D DEFICIENCY, FRACTURES): VIT D 25 HYDROXY: 28 ng/mL — AB (ref 30–100)

## 2016-12-26 LAB — TSH: TSH: 0.66 mIU/L

## 2016-12-29 NOTE — Telephone Encounter (Signed)
Seen 2 1 18 

## 2017-03-02 ENCOUNTER — Other Ambulatory Visit: Payer: Self-pay | Admitting: Family Medicine

## 2017-03-11 ENCOUNTER — Ambulatory Visit (INDEPENDENT_AMBULATORY_CARE_PROVIDER_SITE_OTHER): Payer: Medicaid Other | Admitting: Family Medicine

## 2017-03-11 ENCOUNTER — Encounter: Payer: Self-pay | Admitting: Family Medicine

## 2017-03-11 VITALS — BP 118/70 | HR 64 | Temp 97.8°F | Resp 18 | Ht 60.0 in | Wt 150.8 lb

## 2017-03-11 DIAGNOSIS — Z0000 Encounter for general adult medical examination without abnormal findings: Secondary | ICD-10-CM

## 2017-03-11 DIAGNOSIS — Z23 Encounter for immunization: Secondary | ICD-10-CM | POA: Diagnosis not present

## 2017-03-11 DIAGNOSIS — Z1211 Encounter for screening for malignant neoplasm of colon: Secondary | ICD-10-CM

## 2017-03-11 LAB — HEMOCCULT GUIAC POC 1CARD (OFFICE): FECAL OCCULT BLD: NEGATIVE

## 2017-03-11 NOTE — Assessment & Plan Note (Signed)
Annual exam as documented. . Immunization and cancer screening needs are specifically addressed at this visit.  

## 2017-03-11 NOTE — Patient Instructions (Signed)
F/un in 5 month, call if you need me sooner  Please schedule mammogram at checkout this is past due  No changes in medication   Thank you  for choosing [facility]. We consider it a privelige to serve you.  Delivering excellent health care in a caring and  compassionate way is our goal.  Partnering with you,  so that together we can achieve this goal is our strategy.

## 2017-03-11 NOTE — Progress Notes (Signed)
    Abigail Hopkins     MRN: [id number]      DOB: 1961-02-07  HPI: Patient is in for annual physical exam. No other health concerns are expressed or addressed at the visit. Recent labs,  are reviewed. Immunization is reviewed , and  updated    PE: BP 118/70 (BP Location: Left Arm, Patient Position: Sitting, Cuff Size: Normal)   Pulse 64   Temp 97.8 F (36.6 C) (Other (Comment))   Resp 18   Ht 5' (1.524 m)   Wt 150 lb 12 oz (68.4 kg)   SpO2 98%   BMI 29.44 kg/m   Pleasant  female, alert and oriented x 3, in no cardio-pulmonary distress. Afebrile. HEENT No facial trauma or asymetry. Sinuses non tender.  Extra occullar muscles intact,External ears normal, tympanic membranes clear. Oropharynx moist, no exudate. Neck: supple, no adenopathy,JVD or thyromegaly.No bruits.  Chest: Clear to ascultation bilaterally.No crackles or wheezes. Non tender to palpation  Breast: No asymetry,no masses or lumps. No tenderness. No nipple discharge or inversion. No axillary or supraclavicular adenopathy  Cardiovascular system; Heart sounds normal,  S1 and  S2 ,no S3.  No murmur, or thrill. Apical beat not displaced Peripheral pulses normal.  Abdomen: Soft, mild epigastric tenderness, no organomegaly or masses. No bruits. Bowel sounds normal. No guarding, tenderness or rebound.  Rectal:  Normal sphincter tone. No rectal mass. Guaiac negative stool.  GU: Not examined  Musculoskeletal exam: Full ROM of spine, hips , shoulders and knees. No deformity ,swelling or crepitus noted. No muscle wasting or atrophy.   Neurologic: Cranial nerves 2 to 12 intact. Power, tone ,sensation and reflexes normal throughout. No disturbance in gait. No tremor.  Skin: Intact, no ulceration, erythema , scaling or rash noted. Pigmentation normal throughout  Psych; Abnormal mood and affect.    Assessment & Plan:  Annual physical exam Annual exam as documented.  Immunization and cancer  screening needs are specifically addressed at this visit.   Need for immunization against influenza After obtaining informed consent, the vaccine is  administered by LPN.

## 2017-03-11 NOTE — Assessment & Plan Note (Signed)
After obtaining informed consent, the vaccine is  administered by LPN.  

## 2017-03-20 ENCOUNTER — Ambulatory Visit (HOSPITAL_COMMUNITY)
Admission: RE | Admit: 2017-03-20 | Discharge: 2017-03-20 | Disposition: A | Discharge status: Home / Self Care | Payer: Medicaid Other | Source: Ambulatory Visit | Attending: Family Medicine | Admitting: Family Medicine

## 2017-03-20 DIAGNOSIS — Z1231 Encounter for screening mammogram for malignant neoplasm of breast: Secondary | ICD-10-CM | POA: Insufficient documentation

## 2017-03-20 DIAGNOSIS — Z1239 Encounter for other screening for malignant neoplasm of breast: Secondary | ICD-10-CM

## 2017-03-27 ENCOUNTER — Other Ambulatory Visit: Payer: Self-pay | Admitting: Family Medicine

## 2017-04-01 ENCOUNTER — Other Ambulatory Visit: Payer: Self-pay | Admitting: Gastroenterology

## 2017-04-29 ENCOUNTER — Other Ambulatory Visit: Payer: Self-pay | Admitting: Family Medicine

## 2017-05-26 ENCOUNTER — Other Ambulatory Visit: Payer: Self-pay | Admitting: Family Medicine

## 2017-05-26 NOTE — Telephone Encounter (Signed)
Seen 9 12 18

## 2017-05-27 ENCOUNTER — Other Ambulatory Visit: Payer: Self-pay | Admitting: Family Medicine

## 2017-05-28 ENCOUNTER — Other Ambulatory Visit: Payer: Self-pay | Admitting: Family Medicine

## 2017-05-28 MED ORDER — CLONAZEPAM 0.5 MG PO TABS: 0.5000 mg | ORAL_TABLET | Freq: Two times a day (BID) | ORAL | 2 refills | Status: DC

## 2017-05-28 NOTE — Telephone Encounter (Signed)
Seen 9 12 18 

## 2017-07-30 ENCOUNTER — Other Ambulatory Visit: Payer: Self-pay | Admitting: Family Medicine

## 2017-08-11 ENCOUNTER — Ambulatory Visit: Payer: Medicaid Other | Admitting: Family Medicine

## 2017-08-27 ENCOUNTER — Other Ambulatory Visit: Payer: Self-pay | Admitting: Family Medicine

## 2017-09-14 ENCOUNTER — Encounter: Payer: Self-pay | Admitting: Family Medicine

## 2017-09-14 ENCOUNTER — Ambulatory Visit: Payer: Medicaid Other | Admitting: Family Medicine

## 2017-09-14 VITALS — BP 130/84 | HR 62 | Resp 16 | Ht 60.0 in | Wt 153.0 lb

## 2017-09-14 DIAGNOSIS — E785 Hyperlipidemia, unspecified: Secondary | ICD-10-CM

## 2017-09-14 DIAGNOSIS — I10 Essential (primary) hypertension: Secondary | ICD-10-CM | POA: Diagnosis not present

## 2017-09-14 DIAGNOSIS — K219 Gastro-esophageal reflux disease without esophagitis: Secondary | ICD-10-CM

## 2017-09-14 DIAGNOSIS — F29 Unspecified psychosis not due to a substance or known physiological condition: Secondary | ICD-10-CM

## 2017-09-14 DIAGNOSIS — E663 Overweight: Secondary | ICD-10-CM | POA: Diagnosis not present

## 2017-09-14 DIAGNOSIS — F411 Generalized anxiety disorder: Secondary | ICD-10-CM | POA: Diagnosis not present

## 2017-09-14 NOTE — Patient Instructions (Addendum)
Physical exam with MD Sept 13 or after, call if you need me sooner  Please get fasting labs first week in July, cBC, lipid, cmp and EGFR, TSH  Please work on good  health habits so that your health will improve. 1. Commitment to daily physical activity for 30 to 60  minutes, if you are able to do this.  2. Commitment to wise food choices. Aim for half of your  food intake to be vegetable and fruit, one quarter starchy foods, and one quarter protein. Try to eat on a regular schedule  3 meals per day, snacking between meals should be limited to vegetables or fruits or small portions of nuts. 64 ounces of water per day is generally recommended, unless you have specific health conditions, like heart failure or kidney failure where you will need to limit fluid intake.  3. Commitment to sufficient and a  good quality of physical and mental rest daily, generally between 6 to 8 hours per day.  WITH PERSISTANCE AND PERSEVERANCE, THE IMPOSSIBLE , BECOMES THE NORM! It is important that you exercise regularly at least 30 minutes 5 times a week. If you develop chest pain, have severe difficulty breathing, or feel very tired, stop exercising immediately and seek medical attention    Thank you  for choosing [facility]. We consider it a privelige to serve you.  Delivering excellent health care in a caring and  compassionate way is our goal.  Partnering with you,  so that together we can achieve this goal is our strategy.

## 2017-09-14 NOTE — Progress Notes (Signed)
   Abigail NiemannBrenda S Hopkins     MRN: [id number]      DOB: 12/04/1960   HPI [name] is here for follow up and re-evaluation of chronic medical conditions, medication management and review of any available recent lab and radiology data.  Preventive health is updated, specifically  Cancer screening and Immunization.   Questions or concerns regarding consultations or procedures which the PT has had in the interim are  addressed. The PT denies any adverse reactions to current medications since the last visit.  There are no new concerns.  There are no specific complaints   ROS Denies recent fever or chills. Denies sinus pressure, nasal congestion, ear pain or sore throat. Denies chest congestion, productive cough or wheezing. Denies chest pains, palpitations and leg swelling Denies abdominal pain, nausea, vomiting,diarrhea or constipation.   Denies dysuria, frequency, hesitancy or incontinence. Denies joint pain, swelling and limitation in mobility. Denies headaches, seizures, numbness, or tingling. Denies depression, anxiety or insomnia. Denies skin break down or rash.   PE  BP 130/84   Pulse 62   Resp 16   Ht 5' (1.524 m)   Wt 153 lb (69.4 kg)   SpO2 98%   BMI 29.88 kg/m   Patient alert and oriented and in no cardiopulmonary distress.  HEENT: No facial asymmetry, EOMI,   oropharynx pink and moist.  Neck supple no JVD, no mass.  Chest: Clear to auscultation bilaterally.  CVS: S1, S2 no murmurs, no S3.Regular rate.  ABD: Soft non tender.   Ext: No edema  MS: Adequate ROM spine, shoulders, hips and knees.  Skin: Intact, no ulcerations or rash noted.  Psych: Good eye contact, bluntedl affect.  not anxious or depressed appearing.  CNS: CN 2-12 intact, power,  normal throughout.no focal deficits noted.   Assessment & Plan  ESSENTIAL HYPERTENSION, BENIGN Controlled, no change in medication DASH diet and commitment to daily physical activity for a minimum of 30 minutes discussed  and encouraged, as a part of hypertension management. The importance of attaining a healthy weight is also discussed.  BP/Weight 09/14/2017 03/11/2017 07/31/2016 11/05/2015 07/02/2015 06/04/2015 12/14/2014  Systolic BP 130 118 118 122 129 122 128  Diastolic BP 84 70 82 80 80 82 82  Wt. (Lbs) 153 150.75 152 145 161 150 159.04  BMI 29.88 29.44 29.69 28.32 31.44 27.43 29.08       Psychosis Stable on current medications continue same  Anxiety state Controlled, no change in medication   GERD Controlled, no change in medication   Dyslipidemia Hyperlipidemia:Low fat diet discussed and encouraged.   Lipid Panel  Lab Results  Component Value Date   CHOL 185 12/25/2016   HDL 58 12/25/2016   LDLCALC 116 (H) 12/25/2016   TRIG 57 12/25/2016   CHOLHDL 3.2 12/25/2016   Updated lab needed at/ before next visit.     Overweight Deteriorated. Patient re-educated about  the importance of commitment to a  minimum of 150 minutes of exercise per week.  The importance of healthy food choices with portion control discussed. Encouraged to start a food diary, count calories and to consider  joining a support group. Sample diet sheets offered. Goals set by the patient for the next several months.   Weight /BMI 09/14/2017 03/11/2017 07/31/2016  WEIGHT 153 lb 150 lb 12 oz 152 lb  HEIGHT 5\' 0"  5\' 0"  5\' 0"   BMI 29.88 kg/m2 29.44 kg/m2 29.69 kg/m2

## 2017-09-20 NOTE — Assessment & Plan Note (Signed)
Controlled, no change in medication DASH diet and commitment to daily physical activity for a minimum of 30 minutes discussed and encouraged, as a part of hypertension management. The importance of attaining a healthy weight is also discussed.  BP/Weight 09/14/2017 03/11/2017 07/31/2016 11/05/2015 07/02/2015 06/04/2015 12/14/2014  Systolic BP 130 118 118 122 129 122 128  Diastolic BP 84 70 82 80 80 82 82  Wt. (Lbs) 153 150.75 152 145 161 150 159.04  BMI 29.88 29.44 29.69 28.32 31.44 27.43 29.08

## 2017-09-20 NOTE — Assessment & Plan Note (Signed)
Stable on current medications continue same

## 2017-09-20 NOTE — Assessment & Plan Note (Signed)
Controlled, no change in medication  

## 2017-09-20 NOTE — Assessment & Plan Note (Signed)
Deteriorated. Patient re-educated about  the importance of commitment to a  minimum of 150 minutes of exercise per week.  The importance of healthy food choices with portion control discussed. Encouraged to start a food diary, count calories and to consider  joining a support group. Sample diet sheets offered. Goals set by the patient for the next several months.   Weight /BMI 09/14/2017 03/11/2017 07/31/2016  WEIGHT 153 lb 150 lb 12 oz 152 lb  HEIGHT 5\' 0"  5\' 0"  5\' 0"   BMI 29.88 kg/m2 29.44 kg/m2 29.69 kg/m2

## 2017-09-20 NOTE — Assessment & Plan Note (Signed)
Hyperlipidemia:Low fat diet discussed and encouraged.   Lipid Panel  Lab Results  Component Value Date   CHOL 185 12/25/2016   HDL 58 12/25/2016   LDLCALC 116 (H) 12/25/2016   TRIG 57 12/25/2016   CHOLHDL 3.2 12/25/2016   Updated lab needed at/ before next visit.

## 2017-09-24 ENCOUNTER — Other Ambulatory Visit: Payer: Self-pay | Admitting: Family Medicine

## 2017-10-27 ENCOUNTER — Other Ambulatory Visit: Payer: Self-pay | Admitting: Family Medicine

## 2017-10-27 MED ORDER — CLONAZEPAM 0.5 MG PO TABS: 0.5000 mg | ORAL_TABLET | Freq: Two times a day (BID) | ORAL | 5 refills | Status: DC

## 2017-11-25 ENCOUNTER — Other Ambulatory Visit: Payer: Self-pay | Admitting: Family Medicine

## 2017-11-30 ENCOUNTER — Telehealth: Payer: Self-pay | Admitting: Family Medicine

## 2017-11-30 NOTE — Telephone Encounter (Signed)
The meds were requested by [facility] and we refilled them on 11/26/17. Needs to check with the pharmacy

## 2017-11-30 NOTE — Telephone Encounter (Signed)
Pt is calling states that the pharmacy has faxed, but no response

## 2017-12-01 ENCOUNTER — Telehealth: Payer: Self-pay | Admitting: Family Medicine

## 2017-12-01 NOTE — Telephone Encounter (Signed)
Faxed back.

## 2017-12-01 NOTE — Telephone Encounter (Signed)
[facility] is calling [facility]) you calling regarding RX Fiber Tabs,  Needs a verbal order

## 2017-12-08 ENCOUNTER — Telehealth: Payer: Self-pay | Admitting: Family Medicine

## 2017-12-08 ENCOUNTER — Other Ambulatory Visit: Payer: Self-pay

## 2017-12-08 MED ORDER — PSYLLIUM 0.52 G PO CAPS: ORAL_CAPSULE | ORAL | 5 refills | Status: DC

## 2017-12-08 NOTE — Telephone Encounter (Signed)
Med sent.

## 2017-12-08 NOTE — Telephone Encounter (Signed)
Jennifer from [facility] called to request refill for fiber capsule  Cb#[phone] Fax#: [phone]

## 2017-12-10 NOTE — Telephone Encounter (Signed)
Called to see if the pt received the medicine, and if she needed anything ---she is good

## 2017-12-24 ENCOUNTER — Other Ambulatory Visit: Payer: Self-pay | Admitting: Family Medicine

## 2018-01-26 ENCOUNTER — Other Ambulatory Visit: Payer: Self-pay | Admitting: Family Medicine

## 2018-02-23 ENCOUNTER — Other Ambulatory Visit: Payer: Self-pay | Admitting: Family Medicine

## 2018-03-04 ENCOUNTER — Telehealth: Payer: Self-pay | Admitting: Family Medicine

## 2018-03-04 DIAGNOSIS — F29 Unspecified psychosis not due to a substance or known physiological condition: Secondary | ICD-10-CM

## 2018-03-04 DIAGNOSIS — E785 Hyperlipidemia, unspecified: Secondary | ICD-10-CM

## 2018-03-04 DIAGNOSIS — I10 Essential (primary) hypertension: Secondary | ICD-10-CM

## 2018-03-04 NOTE — Telephone Encounter (Signed)
Labs ordered.

## 2018-03-04 NOTE — Telephone Encounter (Signed)
Please order labs for patients up coming appt

## 2018-03-15 ENCOUNTER — Ambulatory Visit (INDEPENDENT_AMBULATORY_CARE_PROVIDER_SITE_OTHER): Payer: Medicaid Other | Admitting: Family Medicine

## 2018-03-15 ENCOUNTER — Encounter: Payer: Self-pay | Admitting: Family Medicine

## 2018-03-15 ENCOUNTER — Other Ambulatory Visit: Payer: Self-pay

## 2018-03-15 VITALS — BP 120/70 | HR 83 | Resp 12 | Ht 60.0 in | Wt 158.1 lb

## 2018-03-15 DIAGNOSIS — Z23 Encounter for immunization: Secondary | ICD-10-CM

## 2018-03-15 DIAGNOSIS — Z1231 Encounter for screening mammogram for malignant neoplasm of breast: Secondary | ICD-10-CM | POA: Diagnosis not present

## 2018-03-15 DIAGNOSIS — Z0000 Encounter for general adult medical examination without abnormal findings: Secondary | ICD-10-CM | POA: Diagnosis not present

## 2018-03-15 MED ORDER — TRAVOPROST 0.004 % OP SOLN: 1.0000 [drp] | Freq: Every day | OPHTHALMIC | 1 refills | Status: AC

## 2018-03-15 NOTE — Assessment & Plan Note (Addendum)
Annual exam as documented. Counseling done  re healthy lifestyle involving commitment to 150 minutes exercise per week, heart healthy diet, and attaining healthy weight.The importance of adequate sleep also discussed.  Immunization and cancer screening needs are specifically addressed at this visit.

## 2018-03-15 NOTE — Patient Instructions (Addendum)
F/U in 5 months  Please schedule mammogram due in September at checkout  Please get labs that were ordered they are past due  Use tylenol for headaxche as before  Flu vaccine today  Thank you  for choosing [facility]. We consider it a privelige to serve you.  Delivering excellent health care in a caring and  compassionate way is our goal.  Partnering with you,  so that together we can achieve this goal is our strategy.

## 2018-03-15 NOTE — Progress Notes (Signed)
    Abigail NiemannBrenda S Hopkins     MRN: [id number]      DOB: 11/13/1960  HPI: Patient is in for annual physical exam. C/o headaches intermittently , mainly frontal, relieved with tylenol as needed Immunization is reviewed , and  updated    PE: BP 120/70 (BP Location: Right Arm, Patient Position: Sitting, Cuff Size: Normal)   Pulse 83   Resp 12   Ht 5' (1.524 m)   Wt 158 lb 1.9 oz (71.7 kg)   SpO2 99% Comment: room air  BMI 30.88 kg/m   Pleasant  female, alert and oriented x 3, in no cardio-pulmonary distress. Afebrile. HEENT No facial trauma or asymetry. Sinuses non tender.  Extra occullar muscles intact External ears normal, tympanic membranes clear. Oropharynx moist, no exudate. Neck: supple, no adenopathy,JVD or thyromegaly.No bruits.  Chest: Clear to ascultation bilaterally.No crackles or wheezes. Non tender to palpation  Breast: No asymetry,no masses or lumps. No tenderness. No nipple discharge or inversion. No axillary or supraclavicular adenopathy  Cardiovascular system; Heart sounds normal,  S1 and  S2 ,no S3.  No murmur, or thrill. Apical beat not displaced Peripheral pulses normal.  Abdomen: Soft, non tender, no organomegaly or masses. No bruits. Bowel sounds normal. No guarding, tenderness or rebound.  Rectal:  GU: No exam used or indicated.  Musculoskeletal exam: Full ROM of spine, hips , shoulders and knees. No deformity ,swelling or crepitus noted. No muscle wasting or atrophy.   Neurologic: Cranial nerves 2 to 12 intact. Power, tone ,sensation and reflexes normal throughout. No disturbance in gait. No tremor.  Skin: Intact, no ulceration, erythema , scaling or rash noted. Pigmentation normal throughout  Psych; Normal mood and flat affect.   Assessment & Plan:  Need for immunization against influenza After obtaining informed consent, the vaccine is  administered by LPN. '  Annual physical exam Annual exam as documented. Counseling done  re  healthy lifestyle involving commitment to 150 minutes exercise per week, heart healthy diet, and attaining healthy weight.The importance of adequate sleep also discussed.  Immunization and cancer screening needs are specifically addressed at this visit.

## 2018-03-15 NOTE — Assessment & Plan Note (Signed)
After obtaining informed consent, the vaccine is  administered by LPN.  

## 2018-03-16 DIAGNOSIS — Z23 Encounter for immunization: Secondary | ICD-10-CM | POA: Diagnosis not present

## 2018-03-16 LAB — COMPLETE METABOLIC PANEL WITH GFR
AG Ratio: 2 (calc) (ref 1.0–2.5)
ALBUMIN MSPROF: 4.7 g/dL (ref 3.6–5.1)
ALKALINE PHOSPHATASE (APISO): 77 U/L (ref 33–130)
ALT: 33 U/L — ABNORMAL HIGH (ref 6–29)
AST: 20 U/L (ref 10–35)
BUN / CREAT RATIO: 13 (calc) (ref 6–22)
BUN: 14 mg/dL (ref 7–25)
CHLORIDE: 105 mmol/L (ref 98–110)
CO2: 23 mmol/L (ref 20–32)
CREATININE: 1.1 mg/dL — AB (ref 0.50–1.05)
Calcium: 10.2 mg/dL (ref 8.6–10.4)
GFR, Est African American: 65 mL/min/{1.73_m2} (ref >=60)
GFR, Est Non African American: 56 mL/min/{1.73_m2} — ABNORMAL LOW (ref >=60)
GLOBULIN: 2.3 g/dL (ref 1.9–3.7)
GLUCOSE: 101 mg/dL — AB (ref 65–99)
Potassium: 3.8 mmol/L (ref 3.5–5.3)
SODIUM: 140 mmol/L (ref 135–146)
Total Bilirubin: 0.5 mg/dL (ref 0.2–1.2)
Total Protein: 7 g/dL (ref 6.1–8.1)

## 2018-03-16 LAB — LIPID PANEL
CHOL/HDL RATIO: 3 (calc) (ref <5.0)
CHOLESTEROL: 185 mg/dL (ref <200)
HDL: 62 mg/dL (ref >50)
LDL CHOLESTEROL (CALC): 107 mg/dL — AB
Non-HDL Cholesterol (Calc): 123 mg/dL (calc) (ref <130)
Triglycerides: 69 mg/dL (ref <150)

## 2018-03-16 LAB — CBC
HEMATOCRIT: 39.7 % (ref 35.0–45.0)
HEMOGLOBIN: 13.6 g/dL (ref 11.7–15.5)
MCH: 31.4 pg (ref 27.0–33.0)
MCHC: 34.3 g/dL (ref 32.0–36.0)
MCV: 91.7 fL (ref 80.0–100.0)
MPV: 9.9 fL (ref 7.5–12.5)
Platelets: 295 10*3/uL (ref 140–400)
RBC: 4.33 10*6/uL (ref 3.80–5.10)
RDW: 12 % (ref 11.0–15.0)
WBC: 5.1 10*3/uL (ref 3.8–10.8)

## 2018-03-16 LAB — TSH: TSH: 0.67 mIU/L (ref 0.40–4.50)

## 2018-03-24 ENCOUNTER — Encounter (HOSPITAL_COMMUNITY): Payer: Self-pay

## 2018-03-24 ENCOUNTER — Ambulatory Visit (HOSPITAL_COMMUNITY)
Admission: RE | Admit: 2018-03-24 | Discharge: 2018-03-24 | Disposition: A | Discharge status: Home / Self Care | Payer: Medicaid Other | Source: Ambulatory Visit | Attending: Family Medicine | Admitting: Family Medicine

## 2018-03-24 DIAGNOSIS — Z1231 Encounter for screening mammogram for malignant neoplasm of breast: Secondary | ICD-10-CM | POA: Diagnosis present

## 2018-03-29 ENCOUNTER — Other Ambulatory Visit: Payer: Self-pay | Admitting: Family Medicine

## 2018-03-29 ENCOUNTER — Other Ambulatory Visit: Payer: Self-pay | Admitting: Gastroenterology

## 2018-03-30 ENCOUNTER — Other Ambulatory Visit: Payer: Self-pay | Admitting: Family Medicine

## 2018-03-30 MED ORDER — CLONAZEPAM 0.5 MG PO TABS: 0.5000 mg | ORAL_TABLET | Freq: Two times a day (BID) | ORAL | 5 refills | Status: DC

## 2018-04-26 ENCOUNTER — Other Ambulatory Visit: Payer: Self-pay | Admitting: Family Medicine

## 2018-05-25 ENCOUNTER — Other Ambulatory Visit: Payer: Self-pay | Admitting: Family Medicine

## 2018-06-14 ENCOUNTER — Other Ambulatory Visit: Payer: Self-pay | Admitting: Family Medicine

## 2018-06-24 ENCOUNTER — Other Ambulatory Visit: Payer: Self-pay | Admitting: Family Medicine

## 2018-06-29 ENCOUNTER — Other Ambulatory Visit: Payer: Self-pay | Admitting: Family Medicine

## 2018-06-29 DIAGNOSIS — R7611 Nonspecific reaction to tuberculin skin test without active tuberculosis: Secondary | ICD-10-CM

## 2018-07-01 ENCOUNTER — Ambulatory Visit (HOSPITAL_COMMUNITY)
Admission: RE | Admit: 2018-07-01 | Discharge: 2018-07-01 | Disposition: A | Discharge status: Home / Self Care | Payer: Medicaid Other | Source: Ambulatory Visit | Attending: Family Medicine | Admitting: Family Medicine

## 2018-07-01 DIAGNOSIS — R7611 Nonspecific reaction to tuberculin skin test without active tuberculosis: Secondary | ICD-10-CM | POA: Insufficient documentation

## 2018-07-29 ENCOUNTER — Other Ambulatory Visit: Payer: Self-pay | Admitting: Gastroenterology

## 2018-08-16 ENCOUNTER — Encounter: Payer: Self-pay | Admitting: Family Medicine

## 2018-08-16 ENCOUNTER — Other Ambulatory Visit: Payer: Self-pay

## 2018-08-16 ENCOUNTER — Ambulatory Visit (INDEPENDENT_AMBULATORY_CARE_PROVIDER_SITE_OTHER): Payer: Medicaid Other | Admitting: Family Medicine

## 2018-08-16 ENCOUNTER — Telehealth: Payer: Self-pay | Admitting: *Deleted

## 2018-08-16 VITALS — BP 114/78 | HR 70 | Resp 15 | Ht 60.0 in | Wt 156.0 lb

## 2018-08-16 DIAGNOSIS — E785 Hyperlipidemia, unspecified: Secondary | ICD-10-CM | POA: Diagnosis not present

## 2018-08-16 DIAGNOSIS — F29 Unspecified psychosis not due to a substance or known physiological condition: Secondary | ICD-10-CM

## 2018-08-16 DIAGNOSIS — E66811 Obesity, class 1: Secondary | ICD-10-CM

## 2018-08-16 DIAGNOSIS — I10 Essential (primary) hypertension: Secondary | ICD-10-CM

## 2018-08-16 DIAGNOSIS — Z1211 Encounter for screening for malignant neoplasm of colon: Secondary | ICD-10-CM | POA: Diagnosis not present

## 2018-08-16 DIAGNOSIS — K219 Gastro-esophageal reflux disease without esophagitis: Secondary | ICD-10-CM | POA: Diagnosis not present

## 2018-08-16 DIAGNOSIS — E669 Obesity, unspecified: Secondary | ICD-10-CM

## 2018-08-16 MED ORDER — LISINOPRIL 20 MG PO TABS: 20.0000 mg | ORAL_TABLET | Freq: Every day | ORAL | 11 refills | Status: DC

## 2018-08-16 MED ORDER — HYDROCHLOROTHIAZIDE 12.5 MG PO CAPS: 12.5000 mg | ORAL_CAPSULE | Freq: Every day | ORAL | 11 refills | Status: DC

## 2018-08-16 MED ORDER — TOPIRAMATE 100 MG PO TABS: 100.0000 mg | ORAL_TABLET | Freq: Two times a day (BID) | ORAL | 11 refills | Status: DC

## 2018-08-16 NOTE — Assessment & Plan Note (Signed)
Controlled, no change in medication  

## 2018-08-16 NOTE — Telephone Encounter (Signed)
Victorino Dike with [facility] called stating that the topramiate that was sent over was 100 mg twice a day but there was only 30 for the quantity. Pt used to be on 100 mg once a day. Wanted to clarify if there was a dose increase and if there was could it be switched to 60 for the quantity. Can be reached at [phone]

## 2018-08-16 NOTE — Patient Instructions (Signed)
CPE and pap with flu vaccine end August , call if you need me before   Please get fasting lipid, cmp and EGFr and CBC first week in August   No medication changes.  You are referred  For colonoscopy   Congrats on 2 pounds off!  Think about what you will eat, plan ahead. Choose " clean, green, fresh or frozen" over canned, processed or packaged foods which are more sugary, salty and fatty. 70 to 75% of food eaten should be vegetables and fruit. Three meals at set times with snacks allowed between meals, but they must be fruit or vegetables. Aim to eat over a 12 hour period , example 7 am to 7 pm, and STOP after  your last meal of the day. Drink water,generally about 64 ounces per day, no other drink is as healthy. Fruit juice is best enjoyed in a healthy way, by EATING the fruit.

## 2018-08-16 NOTE — Progress Notes (Signed)
RX for Topiramate is corrected. Was sent as take one tab twice daily and only 30 tabs ordered. Corrected to 60 tabs

## 2018-08-16 NOTE — Progress Notes (Signed)
   TWANNA RESH     MRN: [id number]      DOB: 1961/05/29   HPI Ms. Ebrahim is here for follow up and re-evaluation of chronic medical conditions, medication management and review of any available recent lab and radiology data.  Preventive health is updated, specifically  Cancer screening and Immunization.   Questions or concerns regarding consultations or procedures which the PT has had in the interim are  addressed. The PT denies any adverse reactions to current medications since the last visit.  There are no new concerns.  There are no specific complaints   ROS Denies recent fever or chills. Denies sinus pressure, nasal congestion, ear pain or sore throat. Denies chest congestion, productive cough or wheezing. Denies chest pains, palpitations and leg swelling Denies abdominal pain, nausea, vomiting,diarrhea or constipation.   Denies dysuria, frequency, hesitancy or incontinence. Denies joint pain, swelling and limitation in mobility. Denies headaches, seizures, numbness, or tingling. Denies depression, anxiety or insomnia. Denies skin break down or rash.   PE  BP 114/78   Pulse 70   Resp 15   Ht 5' (1.524 m)   Wt 156 lb (70.8 kg)   SpO2 98%   BMI 30.47 kg/m   Patient alert and oriented and in no cardiopulmonary distress.  HEENT: No facial asymmetry, EOMI,   oropharynx pink and moist.  Neck supple no JVD, no mass.  Chest: Clear to auscultation bilaterally.  CVS: S1, S2 no murmurs, no S3.Regular rate.  ABD: Soft non tender.   Ext: No edema  MS: Adequate ROM spine, shoulders, hips and knees.  Skin: Intact, no ulcerations or rash noted.  Psych: Good eye contact, normal affect.  not anxious or depressed appearing.  CNS: CN 2-12 intact, power,  normal throughout.no focal deficits noted.   Assessment & Plan  Dyslipidemia Hyperlipidemia:Low fat diet discussed and encouraged.   Lipid Panel  Lab Results  Component Value Date   CHOL 185 03/16/2018   HDL 62  03/16/2018   LDLCALC 107 (H) 03/16/2018   TRIG 69 03/16/2018   CHOLHDL 3.0 03/16/2018   Updated lab needed at/ before next visit.        GERD Controlled, no change in medication   Obesity (BMI 30.0-34.9) 2 pound weight loss, encouraged to continue same Patient re-educated about  the importance of commitment to a  minimum of 150 minutes of exercise per week as able.  The importance of healthy food choices with portion control discussed, as well as eating regularly and within a 12 hour window most days. The need to choose "clean , green" food 50 to 75% of the time is discussed, as well as to make water the primary drink and set a goal of 64 ounces water daily.  Encouraged to start a food diary,  and to consider  joining a support group. Sample diet sheets offered. Goals set by the patient for the next several months.   Weight /BMI 08/16/2018 03/15/2018 09/14/2017  WEIGHT 156 lb 158 lb 1.9 oz 153 lb  HEIGHT 5' 0"  5' 0"  5' 0"   BMI 30.47 kg/m2 30.88 kg/m2 29.88 kg/m2   '   Psychosis Controlled, no change in medication

## 2018-08-16 NOTE — Assessment & Plan Note (Signed)
2 pound weight loss, encouraged to continue same Patient re-educated about  the importance of commitment to a  minimum of 150 minutes of exercise per week as able.  The importance of healthy food choices with portion control discussed, as well as eating regularly and within a 12 hour window most days. The need to choose "clean , green" food 50 to 75% of the time is discussed, as well as to make water the primary drink and set a goal of 64 ounces water daily.  Encouraged to start a food diary,  and to consider  joining a support group. Sample diet sheets offered. Goals set by the patient for the next several months.   Weight /BMI 08/16/2018 03/15/2018 09/14/2017  WEIGHT 156 lb 158 lb 1.9 oz 153 lb  HEIGHT 5\' 0"  5\' 0"  5\' 0"   BMI 30.47 kg/m2 30.88 kg/m2 29.88 kg/m2   '

## 2018-08-16 NOTE — Assessment & Plan Note (Addendum)
Hyperlipidemia:Low fat diet discussed and encouraged.   Lipid Panel  Lab Results  Component Value Date   CHOL 185 03/16/2018   HDL 62 03/16/2018   LDLCALC 107 (H) 03/16/2018   TRIG 69 03/16/2018   CHOLHDL 3.0 03/16/2018   Updated lab needed at/ before next visit.

## 2018-08-19 ENCOUNTER — Encounter: Payer: Self-pay | Admitting: Gastroenterology

## 2018-09-23 ENCOUNTER — Other Ambulatory Visit: Payer: Self-pay | Admitting: Family Medicine

## 2018-10-18 ENCOUNTER — Other Ambulatory Visit: Payer: Self-pay | Admitting: Family Medicine

## 2018-10-27 ENCOUNTER — Encounter: Payer: Self-pay | Admitting: Gastroenterology

## 2018-10-27 ENCOUNTER — Other Ambulatory Visit: Payer: Self-pay

## 2018-10-27 ENCOUNTER — Ambulatory Visit (INDEPENDENT_AMBULATORY_CARE_PROVIDER_SITE_OTHER): Payer: Medicaid Other | Admitting: Gastroenterology

## 2018-10-27 DIAGNOSIS — Z79899 Other long term (current) drug therapy: Secondary | ICD-10-CM | POA: Diagnosis not present

## 2018-10-27 DIAGNOSIS — Z1211 Encounter for screening for malignant neoplasm of colon: Secondary | ICD-10-CM | POA: Diagnosis not present

## 2018-10-27 NOTE — Patient Instructions (Signed)
1. Colonoscopy to be scheduled. See separate instructions.  

## 2018-10-27 NOTE — Progress Notes (Addendum)
REVIEWED-NO ADDITIONAL RECOMMENDATIONS.  Primary Care Physician:  Kerri Perches, MD Primary GI:  Jonette Eva, MD   Patient Location: Home  Provider Location: [facility] office  Reason for Phone Visit: needs colonoscopy  Persons present on the phone encounter, with roles: Patient, myself Linford Arnold, LPN (updated meds and allergies)  Total time (minutes) spent on medical discussion: 12 minutes  Due to COVID-19, visit was conducted using telephonic method (no video was available).  Visit was requested by patient.  Virtual Visit via Telephone only  I connected with Valentina Gu, Patient's caregiver on 10/27/18 at  2:00 PM EDT by telephone and verified that I am speaking with the correct person using two identifiers.   I discussed the limitations, risks, security and privacy concerns of performing an evaluation and management service by telephone and the availability of in person appointments. I also discussed with the patient that there may be a patient responsible charge related to this service. The patient expressed understanding and agreed to proceed.   HPI:   Patient is a 58 y/o female who presents for telephone visit regarding colonoscopy.  We received referral from her PCP Dr. Lodema Hong.  Patient's last colonoscopy was in 2010.  Patient unable to provide history over the phone.  I spoke with patient's caregiver, Greig Castilla who has taken care of her since 110 at [facility] [place].  Ms. Jenelle Mages states the patient has been doing well.  She has a good appetite.  No concerns regarding abdominal pain.  Bowel function is good.  No blood in the stool or melena.  No weight loss.  Interested in pursuing colonoscopy in a few months once COVID-19 crisis has settled down.  Regarding functional status, patient is ambulatory.  She has some cognitive and memory issues.  Family history is unavailable.      Current Outpatient Medications  Medication  Sig Dispense Refill  . calcium-vitamin D (OSCAL WITH D) 500-200 MG-UNIT TABS tablet TAKE (1) TABLET BY MOUTH TWICE DAILY. 60 tablet 0  . clonazePAM (KLONOPIN) 0.5 MG tablet TAKE (1) TABLET BY MOUTH TWICE DAILY. 60 tablet 0  . hydrochlorothiazide (MICROZIDE) 12.5 MG capsule TAKE (1) CAPSULE BY MOUTH ONCE DAILY. 30 capsule 0  . lisinopril (ZESTRIL) 20 MG tablet TAKE ONE TABLET BY MOUTH DAILY. 30 tablet 0  . OLANZapine (ZYPREXA) 7.5 MG tablet TAKE (1) TABLET BY MOUTH AT BEDTIME. 30 tablet 5  . pantoprazole (PROTONIX) 40 MG tablet TAKE (1) TABLET BY MOUTH ONCE DAILY. 30 tablet 5  . psyllium (QC FIBER LAXATIVE) 0.52 g capsule TAKE (1) CAPSULE BY MOUTH TWICE DAILY. 60 capsule 5  . STOOL SOFTENER 100 MG capsule TAKE (1) CAPSULE BY MOUTH TWICE DAILY. 60 capsule 0  . topiramate (TOPAMAX) 100 MG tablet Take 1 tablet (100 mg total) by mouth 2 (two) times daily. RX corrected 60 tablet 11  . travoprost, benzalkonium, (TRAVATAN) 0.004 % ophthalmic solution Place 1 drop into both eyes at bedtime. 2.5 mL 1   No current facility-administered medications for this visit.     Past Medical History:  Diagnosis Date  . Anxiety   . Constipation   . GERD (gastroesophageal reflux disease)   . GI problem    h/o SB bacterial overgrowth  . HTN (hypertension)   . NASH (nonalcoholic steatohepatitis)   . Obesity    mild  . Psychotic disorder [facility])     Past Surgical History:  Procedure Laterality Date  . ABDOMINAL HYSTERECTOMY    .  ABDOMINAL SURGERY    . CHOLECYSTECTOMY    . COLONOSCOPY  06/2008   normal TI, small hermorrhoids  . complete hysterectomy    . ESOPHAGOGASTRODUODENOSCOPY  10/2007   chronic, mild gastritis, no H.Pylori  . Hydrogen breath test  07/12/2008   Hydrogen level consistent with small bowel bacterial  overgrowth    History reviewed. No pertinent family history.  Social History   Socioeconomic History  . Marital status: Widowed    Spouse name: Not on file  . Number of children: Not  on file  . Years of education: Not on file  . Highest education level: Not on file  Occupational History  . Not on file  Social Needs  . Financial resource strain: Not on file  . Food insecurity:    Worry: Not on file    Inability: Not on file  . Transportation needs:    Medical: Not on file    Non-medical: Not on file  Tobacco Use  . Smoking status: Former Smoker    Packs/day: 1.00    Types: Cigarettes  . Smokeless tobacco: Never Used  Substance and Sexual Activity  . Alcohol use: No  . Drug use: No  . Sexual activity: Not on file  Lifestyle  . Physical activity:    Days per week: Not on file    Minutes per session: Not on file  . Stress: Not on file  Relationships  . Social connections:    Talks on phone: Not on file    Gets together: Not on file    Attends religious service: Not on file    Active member of club or organization: Not on file    Attends meetings of clubs or organizations: Not on file    Relationship status: Not on file  . Intimate partner violence:    Fear of current or ex partner: Not on file    Emotionally abused: Not on file    Physically abused: Not on file    Forced sexual activity: Not on file  Other Topics Concern  . Not on file  Social History Narrative  . Not on file      ROS: Unavailable from patient.  Caregiver provided information.  General: Negative for anorexia, weight loss, fever, chills, fatigue, weakness. Eyes: Negative for vision changes.  ENT: Negative for hoarseness, difficulty swallowing , nasal congestion. CV: Negative for chest pain, angina, palpitations, dyspnea on exertion, peripheral edema.  Respiratory: Negative for dyspnea at rest, dyspnea on exertion, cough, sputum, wheezing.  GI: See history of present illness. GU:  Negative for dysuria, hematuria, urinary incontinence, urinary frequency, nocturnal urination.  MS: Negative for joint pain, low back pain.  Derm: Negative for rash or itching.  Neuro: Negative for  weakness, abnormal sensation, seizure, frequent headaches, memory loss, confusion.  Psych: Negative for anxiety, depression, suicidal ideation, hallucinations.  Endo: Negative for unusual weight change.  Heme: Negative for bruising or bleeding. Allergy: Negative for rash or hives.   Observations/Objective: Exam unavailable.   Lab Results  Component Value Date   WBC 5.1 03/16/2018   HGB 13.6 03/16/2018   HCT 39.7 03/16/2018   MCV 91.7 03/16/2018   PLT 295 03/16/2018   Lab Results  Component Value Date   CREATININE 1.10 (H) 03/16/2018   BUN 14 03/16/2018   NA 140 03/16/2018   K 3.8 03/16/2018   CL 105 03/16/2018   CO2 23 03/16/2018   Lab Results  Component Value Date   ALT 33 (H) 03/16/2018  AST 20 03/16/2018   ALKPHOS 68 07/02/2015   BILITOT 0.5 03/16/2018   No results found for: HAV, HEPAIGM, HEPBIGM, HEPBCAB, HBEAG, HEPCAB   Assessment and Plan: 58 year old female due for screening colonoscopy.  No GI concerns.  Given polypharmacy, plan for deep sedation.  I have discussed the risks, alternatives, benefits with regards to but not limited to the risk of reaction to medication, bleeding, infection, perforation and the patient is agreeable to proceed. Written consent to be obtained.    Follow Up Instructions:    I discussed the assessment and treatment plan with the patient. The patient was provided an opportunity to ask questions and all were answered. The patient agreed with the plan and demonstrated an understanding of the instructions. AVS mailed to patient's home address.   The patient was advised to call back or seek an in-person evaluation if the symptoms worsen or if the condition fails to improve as anticipated.  I provided 12 minutes of non-face-to-face time during this encounter.   Tana Coast, PA-C

## 2018-11-24 ENCOUNTER — Other Ambulatory Visit: Payer: Self-pay | Admitting: Family Medicine

## 2018-11-29 ENCOUNTER — Telehealth: Payer: Self-pay | Admitting: *Deleted

## 2018-11-29 ENCOUNTER — Other Ambulatory Visit: Payer: Self-pay | Admitting: *Deleted

## 2018-11-29 DIAGNOSIS — Z1211 Encounter for screening for malignant neoplasm of colon: Secondary | ICD-10-CM

## 2018-11-29 MED ORDER — PEG 3350-KCL-NA BICARB-NACL 420 G PO SOLR: 4000.0000 mL | Freq: Once | ORAL | 0 refills | Status: AC

## 2018-11-29 NOTE — Telephone Encounter (Signed)
Called spoke with patient and caregiver Abigail Hopkins. Procedure is scheduled for 8/20 at 8:30am. Rx needs to be sent to [facility]. Confirmed mailing address. Aware will mail instructions with pre-op appt. Voiced understanding. Rx sent. Orders entered.

## 2018-12-21 ENCOUNTER — Other Ambulatory Visit: Payer: Self-pay | Admitting: Family Medicine

## 2018-12-22 ENCOUNTER — Other Ambulatory Visit: Payer: Self-pay

## 2018-12-22 ENCOUNTER — Telehealth: Payer: Self-pay | Admitting: *Deleted

## 2018-12-22 MED ORDER — QC FIBER LAXATIVE 0.52 G PO CAPS: ORAL_CAPSULE | ORAL | 5 refills | Status: DC

## 2018-12-22 NOTE — Telephone Encounter (Signed)
Abigail Hopkins with [facility] called she had sent in a refill request for psyllium fiber capsules but had got a notification back from insurance saying refill was not appropriate. Was trying to figure out why the insurance would not go through.

## 2018-12-22 NOTE — Telephone Encounter (Signed)
Refilled medication and sent to [facility]

## 2019-01-26 ENCOUNTER — Other Ambulatory Visit: Payer: Self-pay | Admitting: Nurse Practitioner

## 2019-01-26 ENCOUNTER — Other Ambulatory Visit: Payer: Self-pay | Admitting: Family Medicine

## 2019-01-31 ENCOUNTER — Telehealth: Payer: Self-pay | Admitting: *Deleted

## 2019-01-31 ENCOUNTER — Other Ambulatory Visit: Payer: Self-pay

## 2019-01-31 MED ORDER — OYSTER SHELL CALCIUM/D 500-200 MG-UNIT PO TABS: ORAL_TABLET | ORAL | 2 refills | Status: DC

## 2019-01-31 MED ORDER — DOCUSATE SODIUM 100 MG PO CAPS: ORAL_CAPSULE | ORAL | 2 refills | Status: DC

## 2019-01-31 NOTE — Telephone Encounter (Signed)
Butch Penny with [facility] called needing a refill on oscal with D and the stool softner sent to [facility] in [place].

## 2019-01-31 NOTE — Telephone Encounter (Signed)
These have been sent over and e verification received from pharmacy. Called pharmacy and verified with Butch Penny that she had received them.

## 2019-02-10 NOTE — Patient Instructions (Signed)
Abigail Hopkins  02/10/2019     @PREFPERIOPPHARMACY @   Your procedure is scheduled on  02/17/2019  Report to [facility] at  [place].M.  Call this number if you have problems the morning of surgery:  [phone]   Remember:  Follow the diet and prep instructions given to you by Dr Nona Dell office.                      Take these medicines the morning of surgery with A SIP OF WATER  Clonazepam, protonix, topamax.    Do not wear jewelry, make-up or nail polish.  Do not wear lotions, powders, or perfumes. Please wear deodorant and brush your teeth.  Do not shave 48 hours prior to surgery.  Men may shave face and neck.  Do not bring valuables to the hospital.  [facility] is not responsible for any belongings or valuables.  Contacts, dentures or bridgework may not be worn into surgery.  Leave your suitcase in the car.  After surgery it may be brought to your room.  For patients admitted to the hospital, discharge time will be determined by your treatment team.  Patients discharged the day of surgery will not be allowed to drive home.   Name and phone number of your driver:   family Special instructions:  None  Please read over the following fact sheets that you were given. Anesthesia Post-op Instructions and Care and Recovery After Surgery       Colonoscopy, Adult, Care After This sheet gives you information about how to care for yourself after your procedure. Your health care provider may also give you more specific instructions. If you have problems or questions, contact your health care provider. What can I expect after the procedure? After the procedure, it is common to have:  A small amount of blood in your stool for 24 hours after the procedure.  Some gas.  Mild abdominal cramping or bloating. Follow these instructions at home: General instructions  For the first 24 hours after the procedure: ? Do not drive or use machinery. ? Do not sign important  documents. ? Do not drink alcohol. ? Do your regular daily activities at a slower pace than normal. ? Eat soft, easy-to-digest foods.  Take over-the-counter or prescription medicines only as told by your health care provider. Relieving cramping and bloating   Try walking around when you have cramps or feel bloated.  Apply heat to your abdomen as told by your health care provider. Use a heat source that your health care provider recommends, such as a moist heat pack or a heating pad. ? Place a towel between your skin and the heat source. ? Leave the heat on for 20-30 minutes. ? Remove the heat if your skin turns bright red. This is especially important if you are unable to feel pain, heat, or cold. You may have a greater risk of getting burned. Eating and drinking   Drink enough fluid to keep your urine pale yellow.  Resume your normal diet as instructed by your health care provider. Avoid heavy or fried foods that are hard to digest.  Avoid drinking alcohol for as long as instructed by your health care provider. Contact a health care provider if:  You have blood in your stool 2-3 days after the procedure. Get help right away if:  You have more than a small spotting of blood in your stool.  You pass  large blood clots in your stool.  Your abdomen is swollen.  You have nausea or vomiting.  You have a fever.  You have increasing abdominal pain that is not relieved with medicine. Summary  After the procedure, it is common to have a small amount of blood in your stool. You may also have mild abdominal cramping and bloating.  For the first 24 hours after the procedure, do not drive or use machinery, sign important documents, or drink alcohol.  Contact your health care provider if you have a lot of blood in your stool, nausea or vomiting, a fever, or increased abdominal pain. This information is not intended to replace advice given to you by your health care provider. Make sure  you discuss any questions you have with your health care provider. Document Released: 01/29/2004 Document Revised: 04/08/2017 Document Reviewed: 08/28/2015 Elsevier Patient Education  2020 [place] After These instructions provide you with information about caring for yourself after your procedure. Your health care provider may also give you more specific instructions. Your treatment has been planned according to current medical practices, but problems sometimes occur. Call your health care provider if you have any problems or questions after your procedure. What can I expect after the procedure? After your procedure, you may:  Feel sleepy for several hours.  Feel clumsy and have poor balance for several hours.  Feel forgetful about what happened after the procedure.  Have poor judgment for several hours.  Feel nauseous or vomit.  Have a sore throat if you had a breathing tube during the procedure. Follow these instructions at home: For at least 24 hours after the procedure:      Have a responsible adult stay with you. It is important to have someone help care for you until you are awake and alert.  Rest as needed.  Do not: ? Participate in activities in which you could fall or become injured. ? Drive. ? Use heavy machinery. ? Drink alcohol. ? Take sleeping pills or medicines that cause drowsiness. ? Make important decisions or sign legal documents. ? Take care of children on your own. Eating and drinking  Follow the diet that is recommended by your health care provider.  If you vomit, drink water, juice, or soup when you can drink without vomiting.  Make sure you have little or no nausea before eating solid foods. General instructions  Take over-the-counter and prescription medicines only as told by your health care provider.  If you have sleep apnea, surgery and certain medicines can increase your risk for breathing problems.  Follow instructions from your health care provider about wearing your sleep device: ? Anytime you are sleeping, including during daytime naps. ? While taking prescription pain medicines, sleeping medicines, or medicines that make you drowsy.  If you smoke, do not smoke without supervision.  Keep all follow-up visits as told by your health care provider. This is important. Contact a health care provider if:  You keep feeling nauseous or you keep vomiting.  You feel light-headed.  You develop a rash.  You have a fever. Get help right away if:  You have trouble breathing. Summary  For several hours after your procedure, you may feel sleepy and have poor judgment.  Have a responsible adult stay with you for at least 24 hours or until you are awake and alert. This information is not intended to replace advice given to you by your health care provider. Make sure you discuss any  questions you have with your health care provider. Document Released: 10/07/2015 Document Revised: 09/14/2017 Document Reviewed: 10/07/2015 Elsevier Patient Education  2020 [employer].

## 2019-02-14 ENCOUNTER — Telehealth: Payer: Self-pay

## 2019-02-14 ENCOUNTER — Encounter (HOSPITAL_COMMUNITY): Payer: Self-pay

## 2019-02-14 ENCOUNTER — Other Ambulatory Visit: Payer: Self-pay

## 2019-02-14 ENCOUNTER — Encounter (HOSPITAL_COMMUNITY)
Admission: RE | Admit: 2019-02-14 | Discharge: 2019-02-14 | Disposition: A | Discharge status: Home / Self Care | Payer: Medicaid Other | Source: Ambulatory Visit | Attending: Gastroenterology | Admitting: Gastroenterology

## 2019-02-14 ENCOUNTER — Other Ambulatory Visit (HOSPITAL_COMMUNITY)
Admission: RE | Admit: 2019-02-14 | Discharge: 2019-02-14 | Disposition: A | Discharge status: Home / Self Care | Payer: Medicaid Other | Source: Ambulatory Visit | Attending: Gastroenterology | Admitting: Gastroenterology

## 2019-02-14 ENCOUNTER — Telehealth: Payer: Self-pay | Admitting: Gastroenterology

## 2019-02-14 DIAGNOSIS — Z20828 Contact with and (suspected) exposure to other viral communicable diseases: Secondary | ICD-10-CM | POA: Insufficient documentation

## 2019-02-14 DIAGNOSIS — Z01812 Encounter for preprocedural laboratory examination: Secondary | ICD-10-CM | POA: Diagnosis present

## 2019-02-14 LAB — CBC WITH DIFFERENTIAL/PLATELET
Abs Immature Granulocytes: 0.01 10*3/uL (ref 0.00–0.07)
Basophils Absolute: 0 10*3/uL (ref 0.0–0.1)
Basophils Relative: 1 %
Eosinophils Absolute: 0 10*3/uL (ref 0.0–0.5)
Eosinophils Relative: 0 %
HCT: 39.9 % (ref 36.0–46.0)
Hemoglobin: 13.2 g/dL (ref 12.0–15.0)
Immature Granulocytes: 0 %
Lymphocytes Relative: 55 %
Lymphs Abs: 2.8 10*3/uL (ref 0.7–4.0)
MCH: 31.4 pg (ref 26.0–34.0)
MCHC: 33.1 g/dL (ref 30.0–36.0)
MCV: 94.8 fL (ref 80.0–100.0)
Monocytes Absolute: 0.3 10*3/uL (ref 0.1–1.0)
Monocytes Relative: 6 %
Neutro Abs: 1.9 10*3/uL (ref 1.7–7.7)
Neutrophils Relative %: 38 %
Platelets: 267 10*3/uL (ref 150–400)
RBC: 4.21 MIL/uL (ref 3.87–5.11)
RDW: 12.7 % (ref 11.5–15.5)
WBC: 5.1 10*3/uL (ref 4.0–10.5)
nRBC: 0 % (ref 0.0–0.2)

## 2019-02-14 LAB — COMPREHENSIVE METABOLIC PANEL
ALT: 28 U/L (ref 0–44)
AST: 23 U/L (ref 15–41)
Albumin: 4.5 g/dL (ref 3.5–5.0)
Alkaline Phosphatase: 68 U/L (ref 38–126)
Anion gap: 10 (ref 5–15)
BUN: 16 mg/dL (ref 6–20)
CO2: 22 mmol/L (ref 22–32)
Calcium: 9.6 mg/dL (ref 8.9–10.3)
Chloride: 108 mmol/L (ref 98–111)
Creatinine, Ser: 0.99 mg/dL (ref 0.44–1.00)
GFR calc Af Amer: >60 mL/min (ref >60)
GFR calc non Af Amer: >60 mL/min (ref >60)
Glucose, Bld: 107 mg/dL — ABNORMAL HIGH (ref 70–99)
Potassium: 3.2 mmol/L — ABNORMAL LOW (ref 3.5–5.1)
Sodium: 140 mmol/L (ref 135–145)
Total Bilirubin: 0.8 mg/dL (ref 0.3–1.2)
Total Protein: 7.5 g/dL (ref 6.5–8.1)

## 2019-02-14 LAB — PROTIME-INR
INR: 1 (ref 0.8–1.2)
Prothrombin Time: 12.8 seconds (ref 11.4–15.2)

## 2019-02-14 LAB — SARS CORONAVIRUS 2 (TAT 6-24 HRS): SARS Coronavirus 2: NEGATIVE

## 2019-02-14 MED ORDER — POTASSIUM CHLORIDE CRYS ER 20 MEQ PO TBCR: 20.0000 meq | EXTENDED_RELEASE_TABLET | Freq: Two times a day (BID) | ORAL | 0 refills | Status: DC

## 2019-02-14 MED ORDER — PEG 3350-KCL-NA BICARB-NACL 420 G PO SOLR: 4000.0000 mL | ORAL | 0 refills | Status: DC

## 2019-02-14 NOTE — Telephone Encounter (Signed)
Abigail Hopkins at the [facility] was informed.  She said [facility] will deliver and pt will start this evening.

## 2019-02-14 NOTE — Telephone Encounter (Signed)
Please ensure not already taking potassium. I sent in potassium to take 20 mEq BID for 3 days. Please start today.

## 2019-02-14 NOTE — Telephone Encounter (Signed)
[facility] called office, they didn't have rx for TCS prep. Resent rx.

## 2019-02-16 ENCOUNTER — Other Ambulatory Visit: Payer: Self-pay

## 2019-02-17 ENCOUNTER — Ambulatory Visit (HOSPITAL_COMMUNITY): Payer: Medicaid Other | Admitting: Anesthesiology

## 2019-02-17 ENCOUNTER — Ambulatory Visit (HOSPITAL_COMMUNITY)
Admission: RE | Admit: 2019-02-17 | Discharge: 2019-02-17 | Disposition: A | Discharge status: Home / Self Care | Payer: Medicaid Other | Attending: Gastroenterology | Admitting: Gastroenterology

## 2019-02-17 ENCOUNTER — Other Ambulatory Visit: Payer: Self-pay

## 2019-02-17 ENCOUNTER — Encounter (HOSPITAL_COMMUNITY)
Admission: RE | Disposition: A | Discharge status: Home / Self Care | Payer: Self-pay | Source: Home / Self Care | Attending: Gastroenterology

## 2019-02-17 ENCOUNTER — Encounter (HOSPITAL_COMMUNITY): Payer: Self-pay | Admitting: Anesthesiology

## 2019-02-17 DIAGNOSIS — I10 Essential (primary) hypertension: Secondary | ICD-10-CM | POA: Diagnosis not present

## 2019-02-17 DIAGNOSIS — F209 Schizophrenia, unspecified: Secondary | ICD-10-CM | POA: Diagnosis not present

## 2019-02-17 DIAGNOSIS — K648 Other hemorrhoids: Secondary | ICD-10-CM | POA: Diagnosis not present

## 2019-02-17 DIAGNOSIS — Z87891 Personal history of nicotine dependence: Secondary | ICD-10-CM | POA: Insufficient documentation

## 2019-02-17 DIAGNOSIS — E669 Obesity, unspecified: Secondary | ICD-10-CM | POA: Insufficient documentation

## 2019-02-17 DIAGNOSIS — Z1211 Encounter for screening for malignant neoplasm of colon: Secondary | ICD-10-CM | POA: Insufficient documentation

## 2019-02-17 DIAGNOSIS — K644 Residual hemorrhoidal skin tags: Secondary | ICD-10-CM | POA: Diagnosis not present

## 2019-02-17 DIAGNOSIS — Q438 Other specified congenital malformations of intestine: Secondary | ICD-10-CM | POA: Diagnosis not present

## 2019-02-17 DIAGNOSIS — K219 Gastro-esophageal reflux disease without esophagitis: Secondary | ICD-10-CM | POA: Diagnosis not present

## 2019-02-17 DIAGNOSIS — Z79899 Other long term (current) drug therapy: Secondary | ICD-10-CM | POA: Diagnosis not present

## 2019-02-17 HISTORY — PX: COLONOSCOPY WITH PROPOFOL: SHX5780

## 2019-02-17 SURGERY — COLONOSCOPY WITH PROPOFOL
Anesthesia: General

## 2019-02-17 MED ORDER — LACTATED RINGERS IV SOLN
INTRAVENOUS | Status: DC
  Administered 2019-02-17: 08:00:00 via INTRAVENOUS

## 2019-02-17 MED ORDER — CHLORHEXIDINE GLUCONATE CLOTH 2 % EX PADS: 6.0000 | MEDICATED_PAD | Freq: Once | CUTANEOUS | Status: DC

## 2019-02-17 MED ORDER — PROMETHAZINE HCL 25 MG/ML IJ SOLN: 6.2500 mg | INTRAMUSCULAR | Status: DC | PRN

## 2019-02-17 MED ORDER — PROPOFOL 500 MG/50ML IV EMUL
INTRAVENOUS | Status: DC | PRN
  Administered 2019-02-17: 150 ug/kg/min via INTRAVENOUS

## 2019-02-17 MED ORDER — MIDAZOLAM HCL 2 MG/2ML IJ SOLN: 0.5000 mg | Freq: Once | INTRAMUSCULAR | Status: DC | PRN

## 2019-02-17 MED ORDER — PROPOFOL 10 MG/ML IV BOLUS
INTRAVENOUS | Status: AC
  Filled 2019-02-17: qty 40

## 2019-02-17 MED ORDER — HYDROMORPHONE HCL 1 MG/ML IJ SOLN: 0.2500 mg | INTRAMUSCULAR | Status: DC | PRN

## 2019-02-17 MED ORDER — HYDROCODONE-ACETAMINOPHEN 7.5-325 MG PO TABS: 1.0000 | ORAL_TABLET | Freq: Once | ORAL | Status: DC | PRN

## 2019-02-17 NOTE — Transfer of Care (Signed)
Immediate Anesthesia Transfer of Hopkins Note  Patient: Abigail Hopkins  Procedure(s) Performed: COLONOSCOPY WITH PROPOFOL (N/A )  Patient Location: PACU  Anesthesia Type:General  Level of Consciousness: awake  Airway & Oxygen Therapy: Patient Spontanous Breathing  Post-op Assessment: Report given to RN  Post vital signs: Reviewed and stable  Last Vitals:  Vitals Value Taken Time  BP    Temp    Pulse    Resp    SpO2      Last Pain:  Vitals:   02/17/19 0704  TempSrc: Oral  PainSc: 0-No pain      Patients Stated Pain Goal: 5 (33/38/32 9191)  Complications: No apparent anesthesia complications

## 2019-02-17 NOTE — H&P (Signed)
Primary Care Physician:  Fayrene Helper, MD Primary Gastroenterologist:  Dr. Oneida Alar  Pre-Procedure History & Physical: HPI:  Abigail Hopkins is a 58 y.o. female here for [place].  Past Medical History:  Diagnosis Date  . Anxiety   . Constipation   . GERD (gastroesophageal reflux disease)   . GI problem    h/o SB bacterial overgrowth  . HTN (hypertension)   . NASH (nonalcoholic steatohepatitis)   . Obesity    mild  . Psychotic disorder [facility])     Past Surgical History:  Procedure Laterality Date  . ABDOMINAL HYSTERECTOMY    . ABDOMINAL SURGERY    . CHOLECYSTECTOMY    . COLONOSCOPY  06/2008   normal TI, small hermorrhoids  . complete hysterectomy    . ESOPHAGOGASTRODUODENOSCOPY  10/2007   chronic, mild gastritis, no H.Pylori  . Hydrogen breath test  07/12/2008   Hydrogen level consistent with small bowel bacterial  overgrowth    Prior to Admission medications   Medication Sig Start Date End Date Taking? Authorizing Provider  calcium-vitamin D (OSCAL WITH D) 500-200 MG-UNIT TABS tablet TAKE (1) TABLET BY MOUTH TWICE DAILY. Patient taking differently: Take 1 tablet by mouth 2 (two) times daily.  01/31/19  Yes Fayrene Helper, MD  clonazePAM (KLONOPIN) 0.5 MG tablet TAKE (1) TABLET BY MOUTH TWICE DAILY. Patient taking differently: Take 0.5 mg by mouth 2 (two) times daily.  09/24/18  Yes Perlie Mayo, NP  docusate sodium (STOOL SOFTENER) 100 MG capsule TAKE (1) CAPSULE BY MOUTH TWICE DAILY. Patient taking differently: Take 100 mg by mouth 2 (two) times daily.  01/31/19  Yes Fayrene Helper, MD  hydrochlorothiazide (MICROZIDE) 12.5 MG capsule TAKE (1) CAPSULE BY MOUTH ONCE DAILY. Patient taking differently: Take 12.5 mg by mouth daily.  10/19/18  Yes Fayrene Helper, MD  lisinopril (ZESTRIL) 20 MG tablet TAKE ONE TABLET BY MOUTH DAILY. Patient taking differently: Take 20 mg by mouth daily.  10/19/18  Yes Fayrene Helper, MD  OLANZapine (ZYPREXA) 7.5  MG tablet TAKE (1) TABLET BY MOUTH AT BEDTIME. Patient taking differently: Take 7.5 mg by mouth at bedtime.  01/26/19  Yes Fayrene Helper, MD  pantoprazole (PROTONIX) 40 MG tablet TAKE (1) TABLET BY MOUTH ONCE DAILY. Patient taking differently: Take 40 mg by mouth daily.  01/26/19  Yes Mahala Menghini, PA-C  polyethylene glycol-electrolytes (TRILYTE) 420 g solution Take 4,000 mLs by mouth as directed. 02/14/19  Yes Fields, Sandi L, MD  potassium chloride SA (K-DUR) 20 MEQ tablet Take 1 tablet (20 mEq total) by mouth 2 (two) times daily for 3 days. 02/14/19 02/17/19 Yes Annitta Needs, NP  psyllium (QC FIBER LAXATIVE) 0.52 g capsule TAKE (1) CAPSULE BY MOUTH TWICE DAILY. Patient taking differently: Take 0.52 g by mouth 2 (two) times daily. TAKE (1) CAPSULE BY MOUTH TWICE DAILY. 12/22/18  Yes Fayrene Helper, MD  topiramate (TOPAMAX) 100 MG tablet Take 1 tablet (100 mg total) by mouth 2 (two) times daily. RX corrected 08/16/18  Yes Fayrene Helper, MD  travoprost, benzalkonium, (TRAVATAN) 0.004 % ophthalmic solution Place 1 drop into both eyes at bedtime. 03/15/18  Yes Fayrene Helper, MD    Allergies as of 11/29/2018 - Review Complete 10/27/2018  Allergen Reaction Noted  . Ppd [tuberculin purified protein derivative] Rash 12/24/2016    History reviewed. No pertinent family history.  Social History   Socioeconomic History  . Marital status: Widowed    Spouse name:  Not on file  . Number of children: Not on file  . Years of education: Not on file  . Highest education level: Not on file  Occupational History  . Not on file  Social Needs  . Financial resource strain: Not on file  . Food insecurity    Worry: Not on file    Inability: Not on file  . Transportation needs    Medical: Not on file    Non-medical: Not on file  Tobacco Use  . Smoking status: Former Smoker    Packs/day: 1.00    Types: Cigarettes  . Smokeless tobacco: Never Used  Substance and Sexual Activity  .  Alcohol use: No  . Drug use: No  . Sexual activity: Not on file  Lifestyle  . Physical activity    Days per week: Not on file    Minutes per session: Not on file  . Stress: Not on file  Relationships  . Social Herbalist on phone: Not on file    Gets together: Not on file    Attends religious service: Not on file    Active member of club or organization: Not on file    Attends meetings of clubs or organizations: Not on file    Relationship status: Not on file  . Intimate partner violence    Fear of current or ex partner: Not on file    Emotionally abused: Not on file    Physically abused: Not on file    Forced sexual activity: Not on file  Other Topics Concern  . Not on file  Social History Narrative  . Not on file    Review of Systems: See HPI, otherwise negative ROS   Physical Exam: BP 120/77   Pulse 67   Temp 98.9 F (37.2 C) (Oral)   Resp 18   Ht 5' (1.524 m)   Wt 71.2 kg   SpO2 97%   BMI 30.66 kg/m  General:   Alert,  pleasant and cooperative in NAD Head:  Normocephalic and atraumatic. Neck:  Supple; Lungs:  Clear throughout to auscultation.    Heart:  Regular rate and rhythm. Abdomen:  Soft, nontender and nondistended. Normal bowel sounds, without guarding, and without rebound.   Neurologic:  Alert and  oriented x4;  grossly normal neurologically.  Impression/Plan:    SCREENING  Plan:  1. TCS TODAY DISCUSSED PROCEDURE, BENEFITS, & RISKS: < 1% chance of medication reaction, bleeding, perforation, ASPIRATION, or rupture of spleen/liver requiring surgery to fix it and missed polyps < 1 cm 10-20% of the time.

## 2019-02-17 NOTE — Anesthesia Preprocedure Evaluation (Signed)
Anesthesia Evaluation  Patient identified by MRN, date of birth, ID band Patient awake    Reviewed: Allergy & Precautions, NPO status , Patient's Chart, lab work & pertinent test results  Airway Mallampati: II  TM Distance: >3 FB Neck ROM: Full    Dental no notable dental hx. (+) Edentulous Upper, Edentulous Lower   Pulmonary neg pulmonary ROS, former smoker,    Pulmonary exam normal breath sounds clear to auscultation       Cardiovascular Exercise Tolerance: Good hypertension, Pt. on medications negative cardio ROS Normal cardiovascular examI Rhythm:Regular Rate:Normal     Neuro/Psych Anxiety Schizophrenia negative neurological ROS  negative psych ROS   GI/Hepatic GERD  Medicated and Controlled,(+) Hepatitis -NASH per h/o   Endo/Other  negative endocrine ROS  Renal/GU negative Renal ROS  negative genitourinary   Musculoskeletal negative musculoskeletal ROS (+)   Abdominal   Peds negative pediatric ROS (+)  Hematology negative hematology ROS (+)   Anesthesia Other Findings   Reproductive/Obstetrics negative OB ROS                             Anesthesia Physical Anesthesia Plan  ASA: II  Anesthesia Plan: General   Post-op Pain Management:    Induction: Intravenous  PONV Risk Score and Plan: TIVA, Propofol infusion, Treatment may vary due to age or medical condition and Ondansetron  Airway Management Planned: Simple Face Mask and Nasal Cannula  Additional Equipment:   Intra-op Plan:   Post-operative Plan:   Informed Consent: I have reviewed the patients History and Physical, chart, labs and discussed the procedure including the risks, benefits and alternatives for the proposed anesthesia with the patient or authorized representative who has indicated his/her understanding and acceptance.     Dental advisory given  Plan Discussed with: CRNA  Anesthesia Plan Comments:  (Plan Full PPE use  Plan GA with GETA as needed d/w pt -WTP with same after Q&A )        Anesthesia Quick Evaluation

## 2019-02-17 NOTE — Op Note (Signed)
[facility] Patient Name: Abigail NeuBrenda Fasnacht Procedure Date: 02/17/2019 8:34 AM MRN: [id number] Date of Birth: 59/16/1962 Attending MD: Jonette EvaSandi Sonny Anthes MD, MD CSN: [id number] Age: 58 Admit Type: Outpatient Procedure:                Colonoscopy, SCREENING Indications:              Screening for colorectal malignant neoplasm Providers:                Jonette EvaSandi Vayla Wilhelmi MD, MD, Nena PolioLisa Moore, RN, Burke Keelsrisann                            Til[place], Technician Referring MD:             Milus MallickMargaret E. Simpson MD, MD Medicines:                Propofol per Anesthesia Complications:            No immediate complications. Estimated Blood Loss:     Estimated blood loss: none. Procedure:                Pre-Anesthesia Assessment:                           - Prior to the procedure, a History and Physical                            was performed, and patient medications and                            allergies were reviewed. The patient's tolerance of                            previous anesthesia was also reviewed. The risks                            and benefits of the procedure and the sedation                            options and risks were discussed with the patient.                            All questions were answered, and informed consent                            was obtained. Prior Anticoagulants: The patient has                            taken no previous anticoagulant or antiplatelet                            agents. ASA Grade Assessment: II - A patient with                            mild systemic disease. After reviewing the risks  and benefits, the patient was deemed in                            satisfactory condition to undergo the procedure.                            After obtaining informed consent, the colonoscope                            was passed under direct vision. Throughout the                            procedure, the patient's blood pressure, pulse, and                       oxygen saturations were monitored continuously. The                            [id number] ([id number]) was introduced through the                            anus and advanced to the the cecum, identified by                            appendiceal orifice and ileocecal valve. The                            colonoscopy was somewhat difficult due to a                            tortuous colon. Successful completion of the                            procedure was aided by straightening and shortening                            the scope to obtain bowel loop reduction. The                            patient tolerated the procedure well. The quality                            of the bowel preparation was good. The ileocecal                            valve, appendiceal orifice, and rectum were                            photographed. Scope In: 8:41:00 AM Scope Out: 8:59:44 AM Scope Withdrawal Time: 0 hours 15 minutes 10 seconds  Total Procedure Duration: 0 hours 18 minutes 44 seconds  Findings:      External and internal hemorrhoids were found. The hemorrhoids were small.      The exam was otherwise without abnormality.      The recto-sigmoid colon  and sigmoid colon were mildly tortuous. Impression:               - External and internal hemorrhoids.                           - The examination was otherwise normal.                           - Tortuous LEFT colon. Moderate Sedation:      Per Anesthesia Care Recommendation:           - Patient has a contact number available for                            emergencies. The signs and symptoms of potential                            delayed complications were discussed with the                            patient. Return to normal activities tomorrow.                            Written discharge instructions were provided to the                            patient.                           - High fiber diet.                            - Continue present medications.                           - Repeat colonoscopy in 10 years for surveillance. Procedure Code(s):        --- Professional ---                           [phone] [id number], Colonoscopy, flexible; diagnostic, including                            collection of specimen(s) by brushing or washing,                            when performed (separate procedure) Diagnosis Code(s):        --- Professional ---                           Z12.11, Encounter for screening for malignant                            neoplasm of colon                           K64.8, Other hemorrhoids  Q43.8, Other specified congenital malformations of                            intestine CPT copyright 2019 American Medical Association. All rights reserved. The codes documented in this report are preliminary and upon coder review may  be revised to meet current compliance requirements. Barney Drain, MD Barney Drain MD, MD 02/17/2019 9:23:37 AM This report has been signed electronically. Number of Addenda: 0

## 2019-02-17 NOTE — Anesthesia Postprocedure Evaluation (Signed)
Anesthesia Post Note  Patient: Abigail Hopkins  Procedure(s) Performed: COLONOSCOPY WITH PROPOFOL (N/A )  Patient location during evaluation: PACU Anesthesia Type: General Level of consciousness: awake and alert and oriented Pain management: pain level controlled Vital Signs Assessment: post-procedure vital signs reviewed and stable Respiratory status: spontaneous breathing Cardiovascular status: blood pressure returned to baseline and stable Postop Assessment: no apparent nausea or vomiting Anesthetic complications: no     Last Vitals:  Vitals:   02/17/19 0704 02/17/19 0906  BP: 120/77 (P) 116/72  Pulse: 67 (P) 70  Resp: 18 (P) 17  Temp: 37.2 C (P) 36.6 C  SpO2: 97% (P) 100%    Last Pain:  Vitals:   02/17/19 0704  TempSrc: Oral  PainSc: 0-No pain                 Lavanna Rog

## 2019-02-17 NOTE — Discharge Instructions (Signed)
You have SMALL internal hemorrhoids. YOU DID NOT HAVE ANY POLYPS.   DRINK WATER TO KEEP YOUR URINE LIGHT YELLOW.  FOLLOW A HIGH FIBER DIET. AVOID ITEMS THAT CAUSE BLOATING. SEE INFO BELOW.  USE PREPARATION H FOUR TIMES  A DAY IF NEEDED TO RELIEVE RECTAL PAIN/PRESSURE/BLEEDING.  Next colonoscopy in 10 years.  Colonoscopy Care After Read the instructions outlined below and refer to this sheet in the next week. These discharge instructions provide you with general information on caring for yourself after you leave the hospital. While your treatment has been planned according to the most current medical practices available, unavoidable complications occasionally occur. If you have any problems or questions after discharge, call DR. FIELDS, [phone].  ACTIVITY  You may resume your regular activity, but move at a slower pace for the next 24 hours.   Take frequent rest periods for the next 24 hours.   Walking will help get rid of the air and reduce the bloated feeling in your belly (abdomen).   No driving for 24 hours (because of the medicine (anesthesia) used during the test).   You may shower.   Do not sign any important legal documents or operate any machinery for 24 hours (because of the anesthesia used during the test).    NUTRITION  Drink plenty of fluids.   You may resume your normal diet as instructed by your doctor.   Begin with a light meal and progress to your normal diet. Heavy or fried foods are harder to digest and may make you feel sick to your stomach (nauseated).   Avoid alcoholic beverages for 24 hours or as instructed.    MEDICATIONS  You may resume your normal medications.   WHAT YOU CAN EXPECT TODAY  Some feelings of bloating in the abdomen.   Passage of more gas than usual.   Spotting of blood in your stool or on the toilet paper  .  IF YOU HAD POLYPS REMOVED DURING THE COLONOSCOPY:  Eat a soft diet IF YOU HAVE NAUSEA, BLOATING, ABDOMINAL  PAIN, OR VOMITING.    FINDING OUT THE RESULTS OF YOUR TEST Not all test results are available during your visit. DR. Oneida Alar WILL CALL YOU WITHIN 14 DAYS OF YOUR PROCEDUE WITH YOUR RESULTS. Do not assume everything is normal if you have not heard from DR. FIELDS, CALL HER OFFICE AT [phone].  SEEK IMMEDIATE MEDICAL ATTENTION AND CALL THE OFFICE: [phone] IF:  You have more than a spotting of blood in your stool.   Your belly is swollen (abdominal distention).   You are nauseated or vomiting.   You have a temperature over 101F.   You have abdominal pain or discomfort that is severe or gets worse throughout the day.  High-Fiber Diet A high-fiber diet changes your normal diet to include more whole grains, legumes, fruits, and vegetables. Changes in the diet involve replacing refined carbohydrates with unrefined foods. The calorie level of the diet is essentially unchanged. The Dietary Reference Intake (recommended amount) for adult males is 38 grams per day. For adult females, it is 25 grams per day. Pregnant and lactating women should consume 28 grams of fiber per day. Fiber is the intact part of a plant that is not broken down during digestion. Functional fiber is fiber that has been isolated from the plant to provide a beneficial effect in the body.  PURPOSE  Increase stool bulk.   Ease and regulate bowel movements.   Lower cholesterol.   REDUCE RISK OF COLON  CANCER  INDICATIONS THAT YOU NEED MORE FIBER  Constipation and hemorrhoids.   Uncomplicated diverticulosis (intestine condition) and irritable bowel syndrome.   Weight management.   As a protective measure against hardening of the arteries (atherosclerosis), diabetes, and cancer.   GUIDELINES FOR INCREASING FIBER IN THE DIET  Start adding fiber to the diet slowly. A gradual increase of about 5 more grams (2 slices of whole-wheat bread, 2 servings of most fruits or vegetables, or 1 bowl of high-fiber cereal) per  day is best. Too rapid an increase in fiber may result in constipation, flatulence, and bloating.   Drink enough water and fluids to keep your urine clear or pale yellow. Water, juice, or caffeine-free drinks are recommended. Not drinking enough fluid may cause constipation.   Eat a variety of high-fiber foods rather than one type of fiber.   Try to increase your intake of fiber through using high-fiber foods rather than fiber pills or supplements that contain small amounts of fiber.   The goal is to change the types of food eaten. Do not supplement your present diet with high-fiber foods, but replace foods in your present diet.    INCLUDE A VARIETY OF FIBER SOURCES  Many fruits and vegetables are all good sources of fiber. These include: broccoli, Brussels sprouts, cabbage, cauliflower, beets, sweet potatoes, white potatoes (skin on), carrots, tomatoes, eggplant, squash, berries, fresh fruits, and dried fruits.   You may need to include additional fruits and vegetables each day.   USE COCONUT FLOUR INSTEAD OF WHEAT FLOUR.      Monitored Anesthesia Care, Care After These instructions provide you with information about caring for yourself after your procedure. Your health care provider may also give you more specific instructions. Your treatment has been planned according to current medical practices, but problems sometimes occur. Call your health care provider if you have any problems or questions after your procedure. What can I expect after the procedure? After your procedure, you may:  Feel sleepy for several hours.  Feel clumsy and have poor balance for several hours.  Feel forgetful about what happened after the procedure.  Have poor judgment for several hours.  Feel nauseous or vomit.  Have a sore throat if you had a breathing tube during the procedure. Follow these instructions at home: For at least 24 hours after the procedure:      Have a responsible adult stay  with you. It is important to have someone help care for you until you are awake and alert.  Rest as needed.  Do not: ? Participate in activities in which you could fall or become injured. ? Drive. ? Use heavy machinery. ? Drink alcohol. ? Take sleeping pills or medicines that cause drowsiness. ? Make important decisions or sign legal documents. ? Take care of children on your own. Eating and drinking  Follow the diet that is recommended by your health care provider.  If you vomit, drink water, juice, or soup when you can drink without vomiting.  Make sure you have little or no nausea before eating solid foods. General instructions  Take over-the-counter and prescription medicines only as told by your health care provider.  If you have sleep apnea, surgery and certain medicines can increase your risk for breathing problems. Follow instructions from your health care provider about wearing your sleep device: ? Anytime you are sleeping, including during daytime naps. ? While taking prescription pain medicines, sleeping medicines, or medicines that make you drowsy.  If you smoke,  do not smoke without supervision.  Keep all follow-up visits as told by your health care provider. This is important. Contact a health care provider if:  You keep feeling nauseous or you keep vomiting.  You feel light-headed.  You develop a rash.  You have a fever. Get help right away if:  You have trouble breathing. Summary  For several hours after your procedure, you may feel sleepy and have poor judgment.  Have a responsible adult stay with you for at least 24 hours or until you are awake and alert. This information is not intended to replace advice given to you by your health care provider. Make sure you discuss any questions you have with your health care provider. Document Released: 10/07/2015 Document Revised: 09/14/2017 Document Reviewed: 10/07/2015 Elsevier Patient Education  2020  [employer]

## 2019-02-23 ENCOUNTER — Other Ambulatory Visit (HOSPITAL_COMMUNITY)
Admission: RE | Admit: 2019-02-23 | Discharge: 2019-02-23 | Disposition: A | Discharge status: Home / Self Care | Payer: Medicaid Other | Source: Ambulatory Visit | Attending: Family Medicine | Admitting: Family Medicine

## 2019-02-23 ENCOUNTER — Other Ambulatory Visit: Payer: Self-pay

## 2019-02-23 ENCOUNTER — Ambulatory Visit (INDEPENDENT_AMBULATORY_CARE_PROVIDER_SITE_OTHER): Payer: Medicaid Other | Admitting: Family Medicine

## 2019-02-23 ENCOUNTER — Encounter: Payer: Self-pay | Admitting: Family Medicine

## 2019-02-23 VITALS — BP 110/70 | HR 90 | Temp 97.9°F | Resp 15 | Ht 60.0 in | Wt 152.0 lb

## 2019-02-23 DIAGNOSIS — Z1231 Encounter for screening mammogram for malignant neoplasm of breast: Secondary | ICD-10-CM | POA: Diagnosis not present

## 2019-02-23 DIAGNOSIS — Z124 Encounter for screening for malignant neoplasm of cervix: Secondary | ICD-10-CM | POA: Insufficient documentation

## 2019-02-23 DIAGNOSIS — Z23 Encounter for immunization: Secondary | ICD-10-CM

## 2019-02-23 DIAGNOSIS — E785 Hyperlipidemia, unspecified: Secondary | ICD-10-CM | POA: Diagnosis not present

## 2019-02-23 DIAGNOSIS — E559 Vitamin D deficiency, unspecified: Secondary | ICD-10-CM

## 2019-02-23 DIAGNOSIS — I10 Essential (primary) hypertension: Secondary | ICD-10-CM

## 2019-02-23 DIAGNOSIS — Z0000 Encounter for general adult medical examination without abnormal findings: Secondary | ICD-10-CM

## 2019-02-23 MED ORDER — POTASSIUM CHLORIDE ER 10 MEQ PO TBCR: 10.0000 meq | EXTENDED_RELEASE_TABLET | Freq: Every day | ORAL | 11 refills | Status: DC

## 2019-02-23 NOTE — Assessment & Plan Note (Signed)
Annual exam as documented. Counseling done  re healthy lifestyle involving commitment to 150 minutes exercise per week, heart healthy diet, and attaining healthy weight.The importance of adequate sleep also discussed. Changes in health habits are decided on by the patient with goals and time frames  set for achieving them. Immunization and cancer screening needs are specifically addressed at this visit. 

## 2019-02-23 NOTE — Assessment & Plan Note (Signed)
After obtaining informed consent, the vaccine is  administered , with no adverse effect noted at the time of administration.  

## 2019-02-23 NOTE — Patient Instructions (Addendum)
F/U with MD in 6 months, call if you need me sooner. Please schedule mammogram at checkout    Flu vaccine today  New additional medication, potassium 10 meq one daily  Please get cBC,  fasting lipid cmp and eGFR, TSH and vit D last week in January

## 2019-02-23 NOTE — Progress Notes (Signed)
    Abigail Hopkins     MRN: [id number]      DOB: 1961-04-13  HPI: Patient is in for annual physical exam. No other health concerns are expressed or addressed at the visit. Recent labs, if available are reviewed. Immunization is reviewed , and  updated if needed.   PE: BP 110/70   Pulse 90   Temp 97.9 F (36.6 C) (Temporal)   Resp 15   Ht 5' (1.524 m)   Wt 152 lb (68.9 kg)   SpO2 98%   BMI 29.69 kg/m   Pleasant  female, alert , in no cardio-pulmonary distress. Afebrile. HEENT No facial trauma or asymetry. Sinuses non tender.  Extra occullar muscles intact. External ears normal, . Neck: supple, no adenopathy,JVD or thyromegaly.No bruits.  Chest: Clear to ascultation bilaterally.No crackles or wheezes. Non tender to palpation  Breast: No asymetry,no masses or lumps. No tenderness. No nipple discharge or inversion. No axillary or supraclavicular adenopathy  Cardiovascular system; Heart sounds normal,  S1 and  S2 ,no S3.  No murmur, or thrill. Apical beat not displaced Peripheral pulses normal.  Abdomen: Soft, non tender, no organomegaly or masses. No bruits. Bowel sounds normal. No guarding, tenderness or rebound.  .  GU: External genitalia normal female genitalia , normal female distribution of hair. No lesions. Urethral meatus normal in size, no  Prolapse, no lesions visibly  Present. Bladder non tender. Vagina pink and moist , with no visible lesions , discharge present . Adequate pelvic support no  cystocele or rectocele noted  Uterus absent, no adnexal masses, no  adnexal tenderness.   Musculoskeletal exam: Full ROM of spine, hips , shoulders and knees. No deformity ,swelling or crepitus noted. No muscle wasting or atrophy.   Neurologic: Cranial nerves 2 to 12 intact. Power, tone ,sensation and reflexes normal throughout. No disturbance in gait. Positive tremor.  Skin: Intact, no ulceration, erythema , scaling or rash noted. Pigmentation normal  throughout  Psych; Normal mood and affect.    Assessment & Plan:  Annual physical exam Annual exam as documented. Counseling done  re healthy lifestyle involving commitment to 150 minutes exercise per week, heart healthy diet, and attaining healthy weight.The importance of adequate sleep also discussed.  Changes in health habits are decided on by the patient with goals and time frames  set for achieving them. Immunization and cancer screening needs are specifically addressed at this visit.   Need for immunization against influenza After obtaining informed consent, the vaccine is  administered , with no adverse effect noted at the time of administration.

## 2019-02-25 ENCOUNTER — Other Ambulatory Visit: Payer: Self-pay | Admitting: Family Medicine

## 2019-02-25 LAB — CYTOLOGY - PAP
Diagnosis: NEGATIVE
HPV: NOT DETECTED

## 2019-02-26 ENCOUNTER — Encounter: Payer: Self-pay | Admitting: Family Medicine

## 2019-03-02 ENCOUNTER — Encounter: Payer: Self-pay | Admitting: *Deleted

## 2019-03-26 ENCOUNTER — Other Ambulatory Visit: Payer: Self-pay | Admitting: Family Medicine

## 2019-03-28 ENCOUNTER — Ambulatory Visit (HOSPITAL_COMMUNITY): Payer: Medicaid Other

## 2019-03-28 ENCOUNTER — Other Ambulatory Visit: Payer: Self-pay | Admitting: Family Medicine

## 2019-03-28 MED ORDER — CLONAZEPAM 0.5 MG PO TABS: 0.5000 mg | ORAL_TABLET | Freq: Two times a day (BID) | ORAL | 5 refills | Status: DC | PRN

## 2019-03-29 ENCOUNTER — Telehealth: Payer: Self-pay | Admitting: Family Medicine

## 2019-03-29 NOTE — Telephone Encounter (Signed)
Please call [phone] to confirm if the drug was changed to PRN from Scheduled clonazepam .62m

## 2019-03-30 ENCOUNTER — Other Ambulatory Visit: Payer: Self-pay | Admitting: Family Medicine

## 2019-03-30 MED ORDER — CLONAZEPAM 0.5 MG PO TABS: 0.5000 mg | ORAL_TABLET | Freq: Two times a day (BID) | ORAL | 5 refills | Status: DC

## 2019-03-30 NOTE — Telephone Encounter (Signed)
Written in error as needed, I have sent new direction to pharmacy to reyurn to twice daily and sent in new rx, pls let her know

## 2019-03-30 NOTE — Telephone Encounter (Signed)
[place]

## 2019-05-27 ENCOUNTER — Other Ambulatory Visit: Payer: Self-pay | Admitting: Family Medicine

## 2019-05-30 ENCOUNTER — Other Ambulatory Visit: Payer: Self-pay

## 2019-05-30 MED ORDER — OYSTER SHELL CALCIUM/D 500-200 MG-UNIT PO TABS: 1.0000 | ORAL_TABLET | Freq: Two times a day (BID) | ORAL | 3 refills | Status: DC

## 2019-05-30 MED ORDER — DOCUSATE SODIUM 100 MG PO CAPS: 100.0000 mg | ORAL_CAPSULE | Freq: Two times a day (BID) | ORAL | 3 refills | Status: DC

## 2019-05-30 MED ORDER — OLANZAPINE 7.5 MG PO TABS: ORAL_TABLET | ORAL | 3 refills | Status: DC

## 2019-06-08 ENCOUNTER — Other Ambulatory Visit: Payer: Self-pay

## 2019-06-08 ENCOUNTER — Ambulatory Visit (HOSPITAL_COMMUNITY)
Admission: RE | Admit: 2019-06-08 | Discharge: 2019-06-08 | Disposition: A | Discharge status: Home / Self Care | Payer: Medicaid Other | Source: Ambulatory Visit | Attending: Family Medicine | Admitting: Family Medicine

## 2019-06-08 DIAGNOSIS — Z1231 Encounter for screening mammogram for malignant neoplasm of breast: Secondary | ICD-10-CM

## 2019-06-09 ENCOUNTER — Other Ambulatory Visit (HOSPITAL_COMMUNITY): Payer: Self-pay | Admitting: Family Medicine

## 2019-06-09 DIAGNOSIS — R928 Other abnormal and inconclusive findings on diagnostic imaging of breast: Secondary | ICD-10-CM

## 2019-06-15 ENCOUNTER — Telehealth: Payer: Self-pay | Admitting: *Deleted

## 2019-06-15 NOTE — Telephone Encounter (Signed)
[facility] called needing an authorization on a procedure for 06-21-19 for pt she can be reached at [phone] she will be there until 530pm

## 2019-06-15 NOTE — Telephone Encounter (Signed)
Procedure has been approved. Approval number is [id number]

## 2019-06-21 ENCOUNTER — Ambulatory Visit (HOSPITAL_COMMUNITY)
Admission: RE | Admit: 2019-06-21 | Discharge: 2019-06-21 | Disposition: A | Discharge status: Home / Self Care | Payer: Medicaid Other | Source: Ambulatory Visit | Attending: Family Medicine | Admitting: Family Medicine

## 2019-06-21 ENCOUNTER — Other Ambulatory Visit: Payer: Self-pay

## 2019-06-21 DIAGNOSIS — R928 Other abnormal and inconclusive findings on diagnostic imaging of breast: Secondary | ICD-10-CM

## 2019-07-28 ENCOUNTER — Other Ambulatory Visit: Payer: Self-pay | Admitting: Family Medicine

## 2019-07-28 ENCOUNTER — Telehealth: Payer: Self-pay | Admitting: *Deleted

## 2019-07-28 NOTE — Telephone Encounter (Signed)
Ed with [facility] called the celiumfiber capsule was denied as it said un appropriate at this time and he wanted clarification to see if this should be discontinued. He can be reached at [phone]

## 2019-07-28 NOTE — Telephone Encounter (Signed)
Spoke with pharmacist. Abigail Hopkins ok to refill script. I let him know we had not denied the prescription. He then stated that it is there system, they need to fax OTC refill request and not send them through escribe.

## 2019-08-04 ENCOUNTER — Ambulatory Visit: Payer: Medicaid Other | Admitting: Family Medicine

## 2019-08-05 ENCOUNTER — Ambulatory Visit (INDEPENDENT_AMBULATORY_CARE_PROVIDER_SITE_OTHER): Payer: Medicaid Other | Admitting: Family Medicine

## 2019-08-05 ENCOUNTER — Other Ambulatory Visit: Payer: Self-pay

## 2019-08-05 ENCOUNTER — Encounter: Payer: Self-pay | Admitting: Family Medicine

## 2019-08-05 VITALS — BP 128/86 | HR 100 | Temp 97.7°F | Resp 15 | Ht 60.0 in | Wt 155.1 lb

## 2019-08-05 DIAGNOSIS — Z7189 Other specified counseling: Secondary | ICD-10-CM

## 2019-08-05 DIAGNOSIS — I10 Essential (primary) hypertension: Secondary | ICD-10-CM

## 2019-08-05 DIAGNOSIS — R21 Rash and other nonspecific skin eruption: Secondary | ICD-10-CM | POA: Diagnosis not present

## 2019-08-05 DIAGNOSIS — Z7185 Encounter for immunization safety counseling: Secondary | ICD-10-CM | POA: Insufficient documentation

## 2019-08-05 NOTE — Assessment & Plan Note (Signed)
Of note rash does not appear to have shingle-like formations.  Appears to have multiple possible insect/bug bites.  Denies having any issues with this in the past having bedbugs or any changes in medications or personal hygiene or home products.  There is no blistering no leakage no signs of infection vitals are stable secondary to her having the Covid vaccine would like to refrain from doing any prednisone or antibiotic treatments.  She reports that it is tolerable only itches when she touches it.  And she is not having any pain.  I have advised for her to follow-up with [place] next week if it is not gotten better.  If she develops any cough shortness of breath she is to go to the nearest emergency room.  Can use a little bit of Benadryl cream topically if needed.

## 2019-08-05 NOTE — Assessment & Plan Note (Signed)
Secondary to getting the Covid vaccine this afternoon I have discussed with him to avoid use of prednisone, Benadryl, Motrin or any other medications as possible interference with the immunity system post vaccine.  They are in agreement to this and will call back if the rash gets worse.

## 2019-08-05 NOTE — Assessment & Plan Note (Signed)
Abigail Hopkins is encouraged to maintain a well balanced diet that is low in salt. Controlled, continue current medication regimen. Additionally, she is also reminded that exercise is beneficial for heart health and control of  Blood pressure. 30-60 minutes daily is recommended-walking was suggested.

## 2019-08-05 NOTE — Progress Notes (Deleted)
     Subjective:  Patient ID: Abigail Hopkins, female    DOB: Jan 14, 1961  Age: 59 y.o. MRN: [id number]  CC:  Chief Complaint  Patient presents with  . Rash    left shoulder      HPI  HPI  Today patient denies signs and symptoms of COVID 19 infection including fever, chills, cough, shortness of breath, and headache. Past Medical, Surgical, Social History, Allergies, and Medications have been Reviewed.   Past Medical History:  Diagnosis Date  . Anxiety   . Constipation   . GERD (gastroesophageal reflux disease)   . GI problem    h/o SB bacterial overgrowth  . HTN (hypertension)   . NASH (nonalcoholic steatohepatitis)   . Obesity    mild  . Psychotic disorder (HCC)     Current Meds  Medication Sig  . calcium-vitamin D (OSCAL WITH D) 500-200 MG-UNIT TABS tablet Take 1 tablet by mouth 2 (two) times daily.  . clonazePAM (KLONOPIN) 0.5 MG tablet Take 1 tablet (0.5 mg total) by mouth 2 (two) times daily.  Marland Kitchen docusate sodium (COLACE) 100 MG capsule Take 1 capsule (100 mg total) by mouth 2 (two) times daily.  . hydrochlorothiazide (MICROZIDE) 12.5 MG capsule TAKE (1) CAPSULE BY MOUTH ONCE DAILY. (Patient taking differently: Take 12.5 mg by mouth daily. )  . lisinopril (ZESTRIL) 20 MG tablet TAKE ONE TABLET BY MOUTH DAILY. (Patient taking differently: Take 20 mg by mouth daily. )  . OLANZapine (ZYPREXA) 7.5 MG tablet TAKE (1) TABLET BY MOUTH AT BEDTIME.  . pantoprazole (PROTONIX) 40 MG tablet TAKE (1) TABLET BY MOUTH ONCE DAILY. (Patient taking differently: Take 40 mg by mouth daily. )  . polyethylene glycol-electrolytes (TRILYTE) 420 g solution Take 4,000 mLs by mouth as directed.  . potassium chloride (K-DUR) 10 MEQ tablet Take 1 tablet (10 mEq total) by mouth daily.  . psyllium (QC FIBER LAXATIVE) 0.52 g capsule TAKE (1) CAPSULE BY MOUTH TWICE DAILY. (Patient taking differently: Take 0.52 g by mouth 2 (two) times daily. TAKE (1) CAPSULE BY MOUTH TWICE DAILY.)  . topiramate  (TOPAMAX) 100 MG tablet TAKE 1 TABLET BY MOUTH TWICE A DAY.  . travoprost, benzalkonium, (TRAVATAN) 0.004 % ophthalmic solution Place 1 drop into both eyes at bedtime.    ROS:  ROS   Objective:   Today's Vitals: BP 128/86   Pulse 100   Temp 97.7 F (36.5 C) (Temporal)   Resp 15   Ht 5' (1.524 m)   Wt 155 lb 1.9 oz (70.4 kg)   SpO2 97%   BMI 30.29 kg/m  Vitals with BMI 08/05/2019 02/23/2019 02/17/2019  Height 5' 0"  5' 0"  -  Weight 155 lbs 2 oz 152 lbs -  BMI 62.83 66.29 -  Systolic 476 546 503  Diastolic 86 70 94  Pulse 546 90 74     Physical Exam       Assessment   No diagnosis found.  Tests ordered No orders of the defined types were placed in this encounter.    Plan: Please see assessment and plan per problem list above.   No orders of the defined types were placed in this encounter.   Patient to follow-up in ***.  Perlie Mayo, NP

## 2019-08-05 NOTE — Patient Instructions (Signed)
Happy New Year! May you have a year filled with hope, love, happiness and laughter.  I appreciate the opportunity to provide you with care for your health and wellness. Today we discussed: rash and covid vaccine   Follow up: 08/30/2019  No labs or referrals today  Due to you getting the covid vaccine we need to avoid any medications that can cause this to not be effective. That said, if the rash gets worse, or does not go away or start to go away by Monday please call the office and let me know.  Use tylenol over the weekend for aches and sore arm from shot.  Please continue to practice social distancing to keep you, your family, and our community safe.  If you must go out, please wear a mask and practice good handwashing.  It was a pleasure to see you and I look forward to continuing to work together on your health and well-being. Please do not hesitate to call the office if you need care or have questions about your care.  Have a wonderful day and week. With Gratitude, Cherly Beach, DNP, AGNP-BC

## 2019-08-05 NOTE — Progress Notes (Signed)
Subjective:  Patient ID: Abigail Hopkins, female    DOB: 1960/12/01  Age: 59 y.o. MRN: [id number]  CC:  Chief Complaint  Patient presents with  . Rash    left shoulder      HPI  HPI   Abigail Hopkins is a 59 year old female patient of Dr. Griffin Dakin. Onset of this rash was yesterday.  Reports that it started on the left shoulder.  Started as red bumps.  Does not actively itching when she touches it.  Not aggravated by anything.  Not relieved by anything tried anything over-the-counter.  Reports that it is been consistent over the last 24 hours.  Does not cause her any pain.  Has not had any drainage or blistering of the wounds.  Does not have any association with signs or symptoms of any infection recently.  Has not been exposed to Covid that she knows of.  There have been no new medications.  There have been no new soaps, creams, shampoos, body washes, changes in laundry or dish soaps, changes in clothing or new products.  She does not report having this in the past.  Of note she will be getting the second Covid vaccine this afternoon.  Today patient denies signs and symptoms of COVID 19 infection including fever, chills, cough, shortness of breath, and headache. Past Medical, Surgical, Social History, Allergies, and Medications have been Reviewed.   Past Medical History:  Diagnosis Date  . Anxiety   . Constipation   . GERD (gastroesophageal reflux disease)   . GI problem    h/o SB bacterial overgrowth  . HTN (hypertension)   . NASH (nonalcoholic steatohepatitis)   . Obesity    mild  . Psychotic disorder (HCC)     Current Meds  Medication Sig  . calcium-vitamin D (OSCAL WITH D) 500-200 MG-UNIT TABS tablet Take 1 tablet by mouth 2 (two) times daily.  . clonazePAM (KLONOPIN) 0.5 MG tablet Take 1 tablet (0.5 mg total) by mouth 2 (two) times daily.  Marland Kitchen docusate sodium (COLACE) 100 MG capsule Take 1 capsule (100 mg total) by mouth 2 (two) times daily.  . hydrochlorothiazide  (MICROZIDE) 12.5 MG capsule TAKE (1) CAPSULE BY MOUTH ONCE DAILY. (Patient taking differently: Take 12.5 mg by mouth daily. )  . lisinopril (ZESTRIL) 20 MG tablet TAKE ONE TABLET BY MOUTH DAILY. (Patient taking differently: Take 20 mg by mouth daily. )  . OLANZapine (ZYPREXA) 7.5 MG tablet TAKE (1) TABLET BY MOUTH AT BEDTIME.  . pantoprazole (PROTONIX) 40 MG tablet TAKE (1) TABLET BY MOUTH ONCE DAILY. (Patient taking differently: Take 40 mg by mouth daily. )  . polyethylene glycol-electrolytes (TRILYTE) 420 g solution Take 4,000 mLs by mouth as directed.  . potassium chloride (K-DUR) 10 MEQ tablet Take 1 tablet (10 mEq total) by mouth daily.  . psyllium (QC FIBER LAXATIVE) 0.52 g capsule TAKE (1) CAPSULE BY MOUTH TWICE DAILY. (Patient taking differently: Take 0.52 g by mouth 2 (two) times daily. TAKE (1) CAPSULE BY MOUTH TWICE DAILY.)  . topiramate (TOPAMAX) 100 MG tablet TAKE 1 TABLET BY MOUTH TWICE A DAY.  . travoprost, benzalkonium, (TRAVATAN) 0.004 % ophthalmic solution Place 1 drop into both eyes at bedtime.    ROS:  Review of Systems  Constitutional: Negative.   HENT: Negative.   Eyes: Negative.   Respiratory: Negative.   Cardiovascular: Negative.   Gastrointestinal: Negative.   Genitourinary: Negative.   Musculoskeletal: Negative.   Skin: Positive for rash.  Neurological: Negative.  Endo/Heme/Allergies: Negative.   Psychiatric/Behavioral: Negative.   All other systems reviewed and are negative.    Objective:   Today's Vitals: BP 128/86   Pulse 100   Temp 97.7 F (36.5 C) (Temporal)   Resp 15   Ht 5' (1.524 m)   Wt 155 lb 1.9 oz (70.4 kg)   SpO2 97%   BMI 30.29 kg/m  Vitals with BMI 08/05/2019 02/23/2019 02/17/2019  Height 5' 0"  5' 0"  -  Weight 155 lbs 2 oz 152 lbs -  BMI 79.72 82.06 -  Systolic 015 615 379  Diastolic 86 70 94  Pulse 432 90 74     Physical Exam Vitals and nursing note reviewed.  Constitutional:      Appearance: Normal appearance. She is  well-developed and well-groomed. She is obese.  HENT:     Head: Normocephalic and atraumatic.     Right Ear: External ear normal.     Left Ear: External ear normal.     Nose: Nose normal.     Mouth/Throat:     Mouth: Mucous membranes are moist.     Pharynx: Oropharynx is clear.  Eyes:     General:        Right eye: No discharge.        Left eye: No discharge.     Conjunctiva/sclera: Conjunctivae normal.  Cardiovascular:     Rate and Rhythm: Normal rate and regular rhythm.     Pulses: Normal pulses.     Heart sounds: Normal heart sounds.  Pulmonary:     Effort: Pulmonary effort is normal.     Breath sounds: Normal breath sounds.  Musculoskeletal:        General: Normal range of motion.     Cervical back: Normal range of motion and neck supple.  Skin:    General: Skin is warm.     Findings: Rash present.     Comments: Several red raised bumps across left shoulder anterior side on to upper chest of left breast and around the base of neck on the right side.  Neurological:     General: No focal deficit present.     Mental Status: She is alert and oriented to person, place, and time.  Psychiatric:        Attention and Perception: Attention normal.        Mood and Affect: Mood normal.        Speech: Speech normal.        Behavior: Behavior normal. Behavior is cooperative.        Thought Content: Thought content normal.        Cognition and Memory: Cognition normal.        Judgment: Judgment normal.     Assessment   1. Rash and nonspecific skin eruption   2. Vaccine counseling     Tests ordered No orders of the defined types were placed in this encounter.   Plan: Please see assessment and plan per problem list above.   No orders of the defined types were placed in this encounter.   Patient to follow-up in 08/30/2019   Perlie Mayo, NP

## 2019-08-08 ENCOUNTER — Telehealth: Payer: Self-pay

## 2019-08-08 NOTE — Telephone Encounter (Signed)
Abigail Hopkins is calling to advise that the bumps are about gone, does she need to continue to the medication?

## 2019-08-08 NOTE — Telephone Encounter (Signed)
Most recent visit re rash is with h Jarrett Soho, and I do not see any specific med prescribed by her. If this is the case pls ask Lorre Nick to name the medication she is talking about,

## 2019-08-11 NOTE — Telephone Encounter (Signed)
Said she was just wondering if anything else needed to be done since they had cleared up and I advised no

## 2019-08-25 ENCOUNTER — Other Ambulatory Visit: Payer: Self-pay | Admitting: Family Medicine

## 2019-08-30 ENCOUNTER — Encounter: Payer: Self-pay | Admitting: Family Medicine

## 2019-08-30 ENCOUNTER — Ambulatory Visit (INDEPENDENT_AMBULATORY_CARE_PROVIDER_SITE_OTHER): Payer: Medicaid Other | Admitting: Family Medicine

## 2019-08-30 ENCOUNTER — Other Ambulatory Visit: Payer: Self-pay

## 2019-08-30 VITALS — BP 124/82 | HR 71 | Temp 97.9°F | Ht 60.0 in | Wt 153.8 lb

## 2019-08-30 DIAGNOSIS — E785 Hyperlipidemia, unspecified: Secondary | ICD-10-CM | POA: Diagnosis not present

## 2019-08-30 DIAGNOSIS — F29 Unspecified psychosis not due to a substance or known physiological condition: Secondary | ICD-10-CM

## 2019-08-30 DIAGNOSIS — I10 Essential (primary) hypertension: Secondary | ICD-10-CM | POA: Diagnosis not present

## 2019-08-30 DIAGNOSIS — E559 Vitamin D deficiency, unspecified: Secondary | ICD-10-CM

## 2019-08-30 DIAGNOSIS — K219 Gastro-esophageal reflux disease without esophagitis: Secondary | ICD-10-CM

## 2019-08-30 DIAGNOSIS — E669 Obesity, unspecified: Secondary | ICD-10-CM

## 2019-08-30 NOTE — Assessment & Plan Note (Signed)
Controlled, no change in medication  

## 2019-08-30 NOTE — Patient Instructions (Addendum)
Annual physical exam in office with MD 08/29 or after, call if you need me sooner  No changes in medications  PLEASE get TdAP vaccine at your pharmacy or the health department, if able, this is due   Fasting CBC, lipid, cmp and eGFr, tSH and vit D 1 week before next visit  It is important that you exercise regularly at least 30 minutes 5 times a week. If you develop chest pain, have severe difficulty breathing, or feel very tired, stop exercising immediately and seek medical attention  Thanks for choosing [facility], we consider it a privelige to serve you.

## 2019-08-30 NOTE — Progress Notes (Signed)
   Abigail Hopkins     MRN: [id number]      DOB: Jul 30, 1960   HPI Abigail Hopkins is here for follow up and re-evaluation of chronic medical conditions, medication management and review of any available recent lab and radiology data.  Preventive health is updated, specifically  Cancer screening and Immunization.   The PT denies any adverse reactions to current medications since the last visit.  C/o 3 soft stool this morning, no nausea or  Vomit, no body aches or chills ROS Denies recent fever or chills. Denies sinus pressure, nasal congestion, ear pain or sore throat. Denies chest congestion, productive cough or wheezing. Denies chest pains, palpitations and leg swelling .   Denies dysuria, frequency, hesitancy or incontinence. Denies joint pain, swelling and limitation in mobility. Denies headaches, seizures, numbness, or tingling. Denies depression, anxiety or insomnia. Denies skin break down or rash.   PE  BP 124/82 (BP Location: Left Arm, Patient Position: Sitting)   Pulse 71   Temp 97.9 F (36.6 C) (Temporal)   Ht 5' (1.524 m)   Wt 153 lb 12.8 oz (69.8 kg)   SpO2 99%   BMI 30.04 kg/m   Patient alert and oriented and in no cardiopulmonary distress.  HEENT: No facial asymmetry, EOMI,     Neck supple .  Chest: Clear to auscultation bilaterally.  CVS: S1, S2 no murmurs, no S3.Regular rate.  ABD: Soft non tender.   Ext: No edema  MS: Adequate ROM spine, shoulders, hips and knees.  Skin: Intact, no ulcerations or rash noted.  Psych: Good eye contact, blunt affect. Memory impaired  not anxious or depressed appearing.  CNS: CN 2-12 intact, power,  normal throughout.no focal deficits noted.   Assessment & Plan  ESSENTIAL HYPERTENSION, BENIGN Controlled, no change in medication DASH diet and commitment to daily physical activity for a minimum of 30 minutes discussed and encouraged, as a part of hypertension management. The importance of attaining a healthy weight is also  discussed.  BP/Weight 08/30/2019 08/05/2019 02/23/2019 02/17/2019 08/16/2018 03/15/2018 1/70/0174  Systolic BP 944 967 591 638 466 599 357  Diastolic BP 82 86 70 94 78 70 84  Wt. (Lbs) 153.8 155.12 152 157 156 158.12 153  BMI 30.04 30.29 29.69 30.66 30.47 30.88 29.88       GERD Controlled, no change in medication   Psychosis Controlled, no change in medication   Dyslipidemia Hyperlipidemia:Low fat diet discussed and encouraged.   Lipid Panel  Lab Results  Component Value Date   CHOL 185 03/16/2018   HDL 62 03/16/2018   LDLCALC 107 (H) 03/16/2018   TRIG 69 03/16/2018   CHOLHDL 3.0 03/16/2018    '  Updated lab needed at/ before next visit.   Obesity (BMI 30.0-34.9)  Patient re-educated about  the importance of commitment to a  minimum of 150 minutes of exercise per week as able.  The importance of healthy food choices with portion control discussed, as well as eating regularly and within a 12 hour window most days. The need to choose "clean , green" food 50 to 75% of the time is discussed, as well as to make water the primary drink and set a goal of 64 ounces water daily.    Weight /BMI 08/30/2019 08/05/2019 02/23/2019  WEIGHT 153 lb 12.8 oz 155 lb 1.9 oz 152 lb  HEIGHT 5' 0"  5' 0"  5' 0"   BMI 30.04 kg/m2 30.29 kg/m2 29.69 kg/m2

## 2019-08-30 NOTE — Assessment & Plan Note (Signed)
Hyperlipidemia:Low fat diet discussed and encouraged.   Lipid Panel  Lab Results  Component Value Date   CHOL 185 03/16/2018   HDL 62 03/16/2018   LDLCALC 107 (H) 03/16/2018   TRIG 69 03/16/2018   CHOLHDL 3.0 03/16/2018    '  Updated lab needed at/ before next visit.

## 2019-08-30 NOTE — Assessment & Plan Note (Signed)
Controlled, no change in medication DASH diet and commitment to daily physical activity for a minimum of 30 minutes discussed and encouraged, as a part of hypertension management. The importance of attaining a healthy weight is also discussed.  BP/Weight 08/30/2019 08/05/2019 02/23/2019 02/17/2019 08/16/2018 03/15/2018 1/41/5973  Systolic BP 312 508 719 941 290 475 339  Diastolic BP 82 86 70 94 78 70 84  Wt. (Lbs) 153.8 155.12 152 157 156 158.12 153  BMI 30.04 30.29 29.69 30.66 30.47 30.88 29.88

## 2019-08-30 NOTE — Assessment & Plan Note (Signed)
  Patient re-educated about  the importance of commitment to a  minimum of 150 minutes of exercise per week as able.  The importance of healthy food choices with portion control discussed, as well as eating regularly and within a 12 hour window most days. The need to choose "clean , green" food 50 to 75% of the time is discussed, as well as to make water the primary drink and set a goal of 64 ounces water daily.    Weight /BMI 08/30/2019 08/05/2019 02/23/2019  WEIGHT 153 lb 12.8 oz 155 lb 1.9 oz 152 lb  HEIGHT 5' 0"  5' 0"  5' 0"   BMI 30.04 kg/m2 30.29 kg/m2 29.69 kg/m2

## 2019-09-20 ENCOUNTER — Other Ambulatory Visit: Payer: Self-pay | Admitting: Family Medicine

## 2019-10-25 ENCOUNTER — Other Ambulatory Visit: Payer: Self-pay | Admitting: Family Medicine

## 2019-11-23 ENCOUNTER — Other Ambulatory Visit: Payer: Self-pay | Admitting: Family Medicine

## 2019-12-28 ENCOUNTER — Other Ambulatory Visit: Payer: Self-pay | Admitting: Family Medicine

## 2020-01-31 ENCOUNTER — Other Ambulatory Visit: Payer: Self-pay | Admitting: Gastroenterology

## 2020-02-03 NOTE — Telephone Encounter (Signed)
Needs office visit for refills. I have provided limited refills today.

## 2020-02-08 ENCOUNTER — Encounter: Payer: Self-pay | Admitting: Internal Medicine

## 2020-02-19 ENCOUNTER — Ambulatory Visit
Admission: EM | Admit: 2020-02-19 | Discharge: 2020-02-19 | Disposition: A | Discharge status: Home / Self Care | Payer: Medicaid Other | Attending: Family Medicine | Admitting: Family Medicine

## 2020-02-19 ENCOUNTER — Other Ambulatory Visit: Payer: Self-pay

## 2020-02-19 DIAGNOSIS — B369 Superficial mycosis, unspecified: Secondary | ICD-10-CM

## 2020-02-19 DIAGNOSIS — R21 Rash and other nonspecific skin eruption: Secondary | ICD-10-CM

## 2020-02-19 MED ORDER — CLOTRIMAZOLE 1 % EX CREA
TOPICAL_CREAM | CUTANEOUS | 0 refills | Status: DC
Start: 2020-02-19 — End: 2020-08-08

## 2020-02-19 NOTE — ED Provider Notes (Signed)
[facility]   [id number] 02/19/20 Arrival Time: 1157  CC: RASH  SUBJECTIVE:  Abigail Hopkins is a 59 y.o. female who presents with a skin complaint that began earlier this morning. Reports a red, shiny, itchy rash under the right breast. Denies precipitating event or trauma. Denies changes in soaps, detergents, close contacts with similar rash, known trigger or environmental trigger, allergy. Denies medications change or starting a new medication recently. Has tried cortisone cream without relief. There are no aggravating or alleviating factors. Denies similar symptoms in the past. Denies fever, chills, nausea, vomiting, erythema, swelling, discharge, oral lesions, SOB, chest pain, abdominal pain, changes in bowel or bladder function.    ROS: As per HPI.  All other pertinent ROS negative.     Past Medical History:  Diagnosis Date   Anxiety    Constipation    GERD (gastroesophageal reflux disease)    GI problem    h/o SB bacterial overgrowth   HTN (hypertension)    NASH (nonalcoholic steatohepatitis)    Obesity    mild   Psychotic disorder ([place])    Past Surgical History:  Procedure Laterality Date   ABDOMINAL HYSTERECTOMY     ABDOMINAL SURGERY     CHOLECYSTECTOMY     COLONOSCOPY  06/2008   normal TI, small hermorrhoids   COLONOSCOPY WITH PROPOFOL N/A 02/17/2019   Procedure: COLONOSCOPY WITH PROPOFOL;  Surgeon: Danie Binder, MD;  [facility];  Service: Endoscopy;  Laterality: N/A;  8:30am   complete hysterectomy     ESOPHAGOGASTRODUODENOSCOPY  10/2007   chronic, mild gastritis, no H.Pylori   Hydrogen breath test  07/12/2008   Hydrogen level consistent with small bowel bacterial  overgrowth   Allergies  Allergen Reactions   Ppd [Tuberculin Purified Protein Derivative] Rash    Reported on 12/24/2016 by caregiver   No current facility-administered medications on file prior to encounter.   Current Outpatient Medications on File Prior to  Encounter  Medication Sig Dispense Refill   calcium-vitamin D (OSCAL WITH D) 500-200 MG-UNIT TABS tablet TAKE (1) TABLET BY MOUTH TWICE DAILY. 60 tablet 5   clonazePAM (KLONOPIN) 0.5 MG tablet TAKE (1) TABLET BY MOUTH TWICE DAILY. 60 tablet 5   docusate sodium (COLACE) 100 MG capsule TAKE (1) CAPSULE BY MOUTH TWICE DAILY. 60 capsule 5   hydrochlorothiazide (MICROZIDE) 12.5 MG capsule TAKE (1) CAPSULE BY MOUTH ONCE DAILY. 30 capsule 0   lisinopril (ZESTRIL) 20 MG tablet TAKE ONE TABLET BY MOUTH DAILY. 30 tablet 0   OLANZapine (ZYPREXA) 7.5 MG tablet TAKE (1) TABLET BY MOUTH AT BEDTIME. 30 tablet 5   pantoprazole (PROTONIX) 40 MG tablet TAKE (1) TABLET BY MOUTH ONCE DAILY. 30 tablet 3   potassium chloride (KLOR-CON) 10 MEQ tablet TAKE (1) TABLET BY MOUTH ONCE DAILY. 30 tablet 0   psyllium (QC FIBER LAXATIVE) 0.52 g capsule One capsule twice daily 60 capsule 0   topiramate (TOPAMAX) 100 MG tablet TAKE 1 TABLET BY MOUTH TWICE A DAY. 60 tablet 5   travoprost, benzalkonium, (TRAVATAN) 0.004 % ophthalmic solution Place 1 drop into both eyes at bedtime. 2.5 mL 1   Social History   Socioeconomic History   Marital status: Widowed    Spouse name: Not on file   Number of children: Not on file   Years of education: Not on file   Highest education level: Not on file  Occupational History   Not on file  Tobacco Use   Smoking status: Former Smoker  Packs/day: 1.00    Types: Cigarettes   Smokeless tobacco: Never Used  Vaping Use   Vaping Use: Never used  Substance and Sexual Activity   Alcohol use: No   Drug use: No   Sexual activity: Not on file  Other Topics Concern   Not on file  Social History Narrative   Not on file   Social Determinants of Health   Financial Resource Strain:    Difficulty of Paying Living Expenses: Not on file  Food Insecurity:    Worried About [place] in the Last Year: Not on file   Ran Out of Food in the Last Year: Not on  file  Transportation Needs:    Lack of Transportation (Medical): Not on file   Lack of Transportation (Non-Medical): Not on file  Physical Activity:    Days of Exercise per Week: Not on file   Minutes of Exercise per Session: Not on file  Stress:    Feeling of Stress : Not on file  Social Connections:    Frequency of Communication with Friends and Family: Not on file   Frequency of Social Gatherings with Friends and Family: Not on file   Attends Religious Services: Not on file   Active Member of Clubs or Organizations: Not on file   Attends Archivist Meetings: Not on file   Marital Status: Not on file  Intimate Partner Violence:    Fear of Current or Ex-Partner: Not on file   Emotionally Abused: Not on file   Physically Abused: Not on file   Sexually Abused: Not on file   History reviewed. No pertinent family history.  OBJECTIVE: Vitals:   02/19/20 1302  BP: 133/77  Pulse: 65  Resp: 20  Temp: 97.7 F (36.5 C)  SpO2: 96%    General appearance: alert; no distress Head: NCAT Lungs: clear to auscultation bilaterally Heart: regular rate and rhythm.  Radial pulse 2+ bilaterally Extremities: no edema Skin: warm and dry; erythematous, shiny rash about 3 x 4 cm in area just under the right breast Psychological: alert and cooperative; normal mood and affect  ASSESSMENT & PLAN:  1. Fungal infection of skin   2. Rash and nonspecific skin eruption     Meds ordered this encounter  Medications   clotrimazole (LOTRIMIN) 1 % cream    Sig: Apply to affected area 2 times daily    Dispense:  15 g    Refill:  0    Order Specific Question:   Supervising Provider    Answer:   Chase Picket [[id number]]    Prescribed clotrimazole Rash consistent with fungal infection Take as prescribed and to completion Avoid hot showers/ baths Moisturize skin daily  Follow up with PCP if symptoms persists Return or go to the ER if you have any new or worsening  symptoms such as fever, chills, nausea, vomiting, redness, swelling, discharge, if symptoms do not improve with medications  Reviewed expectations re: course of current medical issues. Questions answered. Outlined signs and symptoms indicating need for more acute intervention. Patient verbalized understanding. After Visit Summary given.   Faustino Congress, NP 02/20/20 1155

## 2020-02-19 NOTE — Discharge Instructions (Signed)
I have sent in clotrimazole to help with the rash  You may apply this 2-3 times per day until rash resolves  Follow up with this office or with primary care if rash is not improving over the next 2-3 days

## 2020-02-19 NOTE — ED Triage Notes (Signed)
Pt presents with rash under right breast that began today

## 2020-02-27 ENCOUNTER — Encounter: Payer: Medicaid Other | Admitting: Family Medicine

## 2020-02-29 ENCOUNTER — Other Ambulatory Visit: Payer: Self-pay | Admitting: Family Medicine

## 2020-03-01 ENCOUNTER — Encounter: Payer: Medicaid Other | Admitting: Family Medicine

## 2020-03-08 ENCOUNTER — Other Ambulatory Visit: Payer: Self-pay | Admitting: Family Medicine

## 2020-04-02 ENCOUNTER — Other Ambulatory Visit: Payer: Self-pay | Admitting: Family Medicine

## 2020-05-03 ENCOUNTER — Encounter: Payer: Medicaid Other | Admitting: Family Medicine

## 2020-05-16 ENCOUNTER — Encounter: Payer: Medicaid Other | Admitting: Family Medicine

## 2020-05-16 ENCOUNTER — Ambulatory Visit (INDEPENDENT_AMBULATORY_CARE_PROVIDER_SITE_OTHER): Payer: Medicaid Other | Admitting: Nurse Practitioner

## 2020-05-16 ENCOUNTER — Encounter: Payer: Self-pay | Admitting: Nurse Practitioner

## 2020-05-16 ENCOUNTER — Other Ambulatory Visit: Payer: Self-pay

## 2020-05-16 VITALS — BP 146/81 | HR 63 | Temp 98.3°F | Resp 20 | Ht 63.0 in | Wt 159.0 lb

## 2020-05-16 DIAGNOSIS — H409 Unspecified glaucoma: Secondary | ICD-10-CM

## 2020-05-16 DIAGNOSIS — H6123 Impacted cerumen, bilateral: Secondary | ICD-10-CM | POA: Diagnosis not present

## 2020-05-16 DIAGNOSIS — K5900 Constipation, unspecified: Secondary | ICD-10-CM

## 2020-05-16 DIAGNOSIS — I10 Essential (primary) hypertension: Secondary | ICD-10-CM | POA: Diagnosis not present

## 2020-05-16 DIAGNOSIS — K7581 Nonalcoholic steatohepatitis (NASH): Secondary | ICD-10-CM

## 2020-05-16 DIAGNOSIS — F29 Unspecified psychosis not due to a substance or known physiological condition: Secondary | ICD-10-CM

## 2020-05-16 DIAGNOSIS — E785 Hyperlipidemia, unspecified: Secondary | ICD-10-CM

## 2020-05-16 DIAGNOSIS — F411 Generalized anxiety disorder: Secondary | ICD-10-CM

## 2020-05-16 DIAGNOSIS — Z0000 Encounter for general adult medical examination without abnormal findings: Secondary | ICD-10-CM

## 2020-05-16 MED ORDER — DEBROX 6.5 % OT SOLN: 5.0000 [drp] | Freq: Two times a day (BID) | OTIC | 0 refills | Status: DC

## 2020-05-16 NOTE — Patient Instructions (Signed)
Bilateral Cerumen Impaction -irrigated today, but unable to remove impaction -Rx. Debrox -follow up on or around Monday and we will try to clear the obstruction; we can draw labs at that time

## 2020-05-16 NOTE — Progress Notes (Signed)
Established Patient Office Visit  Subjective:  Patient ID: Abigail Hopkins, female    DOB: March 12, 1961  Age: 59 y.o. MRN: [id number]  CC:  Chief Complaint  Patient presents with  . Annual Exam    HPI Abigail Hopkins presents for annual physical exam, but there are no labs to review today.  Past Medical History:  Diagnosis Date  . Anxiety   . Constipation   . GERD (gastroesophageal reflux disease)   . GI problem    h/o SB bacterial overgrowth  . HTN (hypertension)   . NASH (nonalcoholic steatohepatitis)   . Obesity    mild  . Psychotic disorder [facility])     Past Surgical History:  Procedure Laterality Date  . ABDOMINAL HYSTERECTOMY    . ABDOMINAL SURGERY    . CHOLECYSTECTOMY    . COLONOSCOPY  06/2008   normal TI, small hermorrhoids  . COLONOSCOPY WITH PROPOFOL N/A 02/17/2019   Procedure: COLONOSCOPY WITH PROPOFOL;  Surgeon: Danie Binder, MD;  Location: [facility];  Service: Endoscopy;  Laterality: N/A;  8:30am  . complete hysterectomy    . ESOPHAGOGASTRODUODENOSCOPY  10/2007   chronic, mild gastritis, no H.Pylori  . Hydrogen breath test  07/12/2008   Hydrogen level consistent with small bowel bacterial  overgrowth    History reviewed. No pertinent family history.  Social History   Socioeconomic History  . Marital status: Widowed    Spouse name: Not on file  . Number of children: Not on file  . Years of education: Not on file  . Highest education level: Not on file  Occupational History  . Not on file  Tobacco Use  . Smoking status: Former Smoker    Packs/day: 1.00    Types: Cigarettes  . Smokeless tobacco: Never Used  Vaping Use  . Vaping Use: Never used  Substance and Sexual Activity  . Alcohol use: No  . Drug use: No  . Sexual activity: Not on file  Other Topics Concern  . Not on file  Social History Narrative  . Not on file   Social Determinants of Health   Financial Resource Strain:   . Difficulty of Paying Living Expenses: Not on file  Food  Insecurity:   . Worried About [employer] fund[place] in the Last Year: Not on file  . Ran Out of Food in the Last Year: Not on file  Transportation Needs:   . Lack of Transportation (Medical): Not on file  . Lack of Transportation (Non-Medical): Not on file  Physical Activity:   . Days of Exercise per Week: Not on file  . Minutes of Exercise per Session: Not on file  Stress:   . Feeling of Stress : Not on file  Social Connections:   . Frequency of Communication with Friends and Family: Not on file  . Frequency of Social Gatherings with Friends and Family: Not on file  . Attends Religious Services: Not on file  . Active Member of Clubs or Organizations: Not on file  . Attends Archivist Meetings: Not on file  . Marital Status: Not on file  Intimate Partner Violence:   . Fear of Current or Ex-Partner: Not on file  . Emotionally Abused: Not on file  . Physically Abused: Not on file  . Sexually Abused: Not on file    Outpatient Medications Prior to Visit  Medication Sig Dispense Refill  . calcium-vitamin D (OSCAL WITH D) 500-200 MG-UNIT TABS tablet TAKE (1) TABLET BY MOUTH TWICE  DAILY. 60 tablet 0  . clonazePAM (KLONOPIN) 0.5 MG tablet TAKE (1) TABLET BY MOUTH TWICE DAILY. 60 tablet 0  . clotrimazole (LOTRIMIN) 1 % cream Apply to affected area 2 times daily 15 g 0  . docusate sodium (COLACE) 100 MG capsule TAKE (1) CAPSULE BY MOUTH TWICE DAILY. 60 capsule 0  . hydrochlorothiazide (MICROZIDE) 12.5 MG capsule TAKE (1) CAPSULE BY MOUTH ONCE DAILY. 30 capsule 0  . lisinopril (ZESTRIL) 20 MG tablet TAKE ONE TABLET BY MOUTH DAILY. 30 tablet 0  . OLANZapine (ZYPREXA) 7.5 MG tablet TAKE (1) TABLET BY MOUTH AT BEDTIME. 30 tablet 0  . pantoprazole (PROTONIX) 40 MG tablet TAKE (1) TABLET BY MOUTH ONCE DAILY. 30 tablet 3  . potassium chloride (KLOR-CON) 10 MEQ tablet TAKE (1) TABLET BY MOUTH ONCE DAILY. 30 tablet 0  . psyllium (QC FIBER LAXATIVE) 0.52 g capsule One capsule twice daily 60  capsule 0  . topiramate (TOPAMAX) 100 MG tablet TAKE 1 TABLET BY MOUTH TWICE A DAY. 60 tablet 0  . travoprost, benzalkonium, (TRAVATAN) 0.004 % ophthalmic solution Place 1 drop into both eyes at bedtime. 2.5 mL 1   No facility-administered medications prior to visit.    Allergies  Allergen Reactions  . Ppd [Tuberculin Purified Protein Derivative] Rash    Reported on 12/24/2016 by caregiver    ROS Review of Systems  Constitutional: Negative.   HENT: Negative.   Respiratory: Negative for cough, chest tightness, shortness of breath and wheezing.   Cardiovascular: Negative for chest pain, palpitations and leg swelling.  Gastrointestinal: Negative.   Endocrine: Negative.   Genitourinary: Negative.   Musculoskeletal: Negative.   Skin: Negative.   Neurological: Negative.       Objective:    Physical Exam Constitutional:      Appearance: Normal appearance.  HENT:     Head: Normocephalic and atraumatic.     Right Ear: There is impacted cerumen.     Left Ear: There is impacted cerumen.     Nose: Nose normal.     Mouth/Throat:     Mouth: Mucous membranes are moist.     Pharynx: Oropharynx is clear.  Eyes:     Extraocular Movements: Extraocular movements intact.     Conjunctiva/sclera: Conjunctivae normal.     Pupils: Pupils are equal, round, and reactive to light.  Cardiovascular:     Rate and Rhythm: Normal rate and regular rhythm.     Pulses: Normal pulses.     Heart sounds: Normal heart sounds.  Pulmonary:     Effort: Pulmonary effort is normal.     Breath sounds: Normal breath sounds.  Abdominal:     General: Bowel sounds are normal.     Palpations: Abdomen is soft.  Musculoskeletal:        General: Normal range of motion.     Cervical back: Normal range of motion and neck supple.  Skin:    General: Skin is warm and dry.  Neurological:     General: No focal deficit present.     Mental Status: She is alert. Mental status is at baseline.     BP (!) 146/81    Pulse 63   Temp 98.3 F (36.8 C)   Resp 20   Ht _0  (1.6 m)   Wt 159 lb (72.1 kg)   SpO2 96%   BMI 28.17 kg/m  Wt Readings from Last 3 Encounters:  05/16/20 159 lb (72.1 kg)  08/30/19 153 lb 12.8 oz (69.8 kg)  08/05/19  155 lb 1.9 oz (70.4 kg)     Health Maintenance Due  Topic Date Due  . INFLUENZA VACCINE  01/29/2020    There are no preventive care reminders to display for this patient.  Lab Results  Component Value Date   TSH 0.67 03/16/2018   Lab Results  Component Value Date   WBC 5.1 02/14/2019   HGB 13.2 02/14/2019   HCT 39.9 02/14/2019   MCV 94.8 02/14/2019   PLT 267 02/14/2019   Lab Results  Component Value Date   NA 140 02/14/2019   K 3.2 (L) 02/14/2019   CO2 22 02/14/2019   GLUCOSE 107 (H) 02/14/2019   BUN 16 02/14/2019   CREATININE 0.99 02/14/2019   BILITOT 0.8 02/14/2019   ALKPHOS 68 02/14/2019   AST 23 02/14/2019   ALT 28 02/14/2019   PROT 7.5 02/14/2019   ALBUMIN 4.5 02/14/2019   CALCIUM 9.6 02/14/2019   ANIONGAP 10 02/14/2019   Lab Results  Component Value Date   CHOL 185 03/16/2018   Lab Results  Component Value Date   HDL 62 03/16/2018   Lab Results  Component Value Date   LDLCALC 107 (H) 03/16/2018   Lab Results  Component Value Date   TRIG 69 03/16/2018   Lab Results  Component Value Date   CHOLHDL 3.0 03/16/2018   Lab Results  Component Value Date   HGBA1C 5.5 02/01/2010      Assessment & Plan:   Problem List Items Addressed This Visit      Cardiovascular and Mediastinum   ESSENTIAL HYPERTENSION, BENIGN     Digestive   NASH (nonalcoholic steatohepatitis)     Other   Dyslipidemia   Relevant Orders   Lipid Panel With LDL/HDL Ratio   Psychosis ([place])   Anxiety state    Other Visit Diagnoses    Bilateral impacted cerumen    -  Primary   Glaucoma, unspecified glaucoma type, unspecified laterality       Hypocalcemia       Relevant Orders   Vitamin D (25 hydroxy)   Constipation, unspecified constipation  type       Routine medical exam       Relevant Orders   CBC with Differential/Platelet   CMP14+EGFR   TSH     Bilateral Cerumen Impaction -irrigated today, but unable to remove impaction -Rx. Debrox -follow up on or around Monday and we will try to clear the obstruction  HTN -BP slightly elevated today at 146/81 -no labs available, will consider increasing meds based on labs; may defer until next appointment -takes HCTZ 12.5 mg daily and lisinopril 20 mg daily -recheck BP at next visit  NASH -no labs to review today -CMP ordered  HLD -no labs to review today -ordered lipid panel  Psychosis -lives at [facility] -has caregiver with her -takes olanzapine 7.5 mg PO qhs -takes topamax 100 mg BID  Glaucoma -had recent eye exam -takes travoprost 1 gtt both eyes qhs  Hypocalcemia -no labs to review today -will check CMP  Anxiety -takes klonopin 0.5 mg BID -no issues today  Constipation -no issues today -takes colace 100 mg BID as well as psyllium capsule BID  Flu shot administered today  Meds ordered this encounter  Medications  . carbamide peroxide (DEBROX) 6.5 % OTIC solution    Sig: Place 5 drops into both ears 2 (two) times daily.    Dispense:  15 mL    Refill:  0    Follow-up: Return  in about 1 week (around 05/23/2020) for removal of ear wax.    Noreene Larsson, NP

## 2020-05-22 ENCOUNTER — Ambulatory Visit (INDEPENDENT_AMBULATORY_CARE_PROVIDER_SITE_OTHER): Payer: Medicaid Other | Admitting: Nurse Practitioner

## 2020-05-22 ENCOUNTER — Other Ambulatory Visit: Payer: Self-pay

## 2020-05-22 ENCOUNTER — Encounter: Payer: Self-pay | Admitting: Nurse Practitioner

## 2020-05-22 VITALS — BP 148/90 | HR 88 | Temp 98.9°F | Resp 20 | Ht 60.0 in | Wt 160.0 lb

## 2020-05-22 DIAGNOSIS — H6123 Impacted cerumen, bilateral: Secondary | ICD-10-CM

## 2020-05-22 NOTE — Progress Notes (Signed)
Acute Office Visit  Subjective:    Patient ID: Abigail Hopkins, female    DOB: 1960/08/09, 59 y.o.   MRN: [id number]  Chief Complaint  Patient presents with  . Cerumen Impaction    Bilateral ear wash    HPI Patient is in today for bilateral cerumen impaction.  At last OV about a week ago, she had bialteral cerumen impaction.  Irrigation became painful, so we prescribed debrox.  She returns today for irrigation.  Past Medical History:  Diagnosis Date  . Anxiety   . Constipation   . GERD (gastroesophageal reflux disease)   . GI problem    h/o SB bacterial overgrowth  . HTN (hypertension)   . NASH (nonalcoholic steatohepatitis)   . Obesity    mild  . Psychotic disorder [facility])     Past Surgical History:  Procedure Laterality Date  . ABDOMINAL HYSTERECTOMY    . ABDOMINAL SURGERY    . CHOLECYSTECTOMY    . COLONOSCOPY  06/2008   normal TI, small hermorrhoids  . COLONOSCOPY WITH PROPOFOL N/A 02/17/2019   Procedure: COLONOSCOPY WITH PROPOFOL;  Surgeon: Danie Binder, MD;  Location: [facility];  Service: Endoscopy;  Laterality: N/A;  8:30am  . complete hysterectomy    . ESOPHAGOGASTRODUODENOSCOPY  10/2007   chronic, mild gastritis, no H.Pylori  . Hydrogen breath test  07/12/2008   Hydrogen level consistent with small bowel bacterial  overgrowth    No family history on file.  Social History   Socioeconomic History  . Marital status: Widowed    Spouse name: Not on file  . Number of children: Not on file  . Years of education: Not on file  . Highest education level: Not on file  Occupational History  . Not on file  Tobacco Use  . Smoking status: Former Smoker    Packs/day: 1.00    Types: Cigarettes  . Smokeless tobacco: Never Used  Vaping Use  . Vaping Use: Never used  Substance and Sexual Activity  . Alcohol use: No  . Drug use: No  . Sexual activity: Not on file  Other Topics Concern  . Not on file  Social History Narrative  . Not on file   Social  Determinants of Health   Financial Resource Strain:   . Difficulty of Paying Living Expenses: Not on file  Food Insecurity:   . Worried About Charity fundraiser in the Last Year: Not on file  . Ran Out of Food in the Last Year: Not on file  Transportation Needs:   . Lack of Transportation (Medical): Not on file  . Lack of Transportation (Non-Medical): Not on file  Physical Activity:   . Days of Exercise per Week: Not on file  . Minutes of Exercise per Session: Not on file  Stress:   . Feeling of Stress : Not on file  Social Connections:   . Frequency of Communication with Friends and Family: Not on file  . Frequency of Social Gatherings with Friends and Family: Not on file  . Attends Religious Services: Not on file  . Active Member of Clubs or Organizations: Not on file  . Attends Archivist Meetings: Not on file  . Marital Status: Not on file  Intimate Partner Violence:   . Fear of Current or Ex-Partner: Not on file  . Emotionally Abused: Not on file  . Physically Abused: Not on file  . Sexually Abused: Not on file    Outpatient Medications Prior to  Visit  Medication Sig Dispense Refill  . calcium-vitamin D (OSCAL WITH D) 500-200 MG-UNIT TABS tablet TAKE (1) TABLET BY MOUTH TWICE DAILY. 60 tablet 0  . carbamide peroxide (DEBROX) 6.5 % OTIC solution Place 5 drops into both ears 2 (two) times daily. 15 mL 0  . clonazePAM (KLONOPIN) 0.5 MG tablet TAKE (1) TABLET BY MOUTH TWICE DAILY. 60 tablet 0  . clotrimazole (LOTRIMIN) 1 % cream Apply to affected area 2 times daily 15 g 0  . docusate sodium (COLACE) 100 MG capsule TAKE (1) CAPSULE BY MOUTH TWICE DAILY. 60 capsule 0  . hydrochlorothiazide (MICROZIDE) 12.5 MG capsule TAKE (1) CAPSULE BY MOUTH ONCE DAILY. 30 capsule 0  . lisinopril (ZESTRIL) 20 MG tablet TAKE ONE TABLET BY MOUTH DAILY. 30 tablet 0  . OLANZapine (ZYPREXA) 7.5 MG tablet TAKE (1) TABLET BY MOUTH AT BEDTIME. 30 tablet 0  . pantoprazole (PROTONIX) 40 MG  tablet TAKE (1) TABLET BY MOUTH ONCE DAILY. 30 tablet 3  . potassium chloride (KLOR-CON) 10 MEQ tablet TAKE (1) TABLET BY MOUTH ONCE DAILY. 30 tablet 0  . psyllium (QC FIBER LAXATIVE) 0.52 g capsule One capsule twice daily 60 capsule 0  . topiramate (TOPAMAX) 100 MG tablet TAKE 1 TABLET BY MOUTH TWICE A DAY. 60 tablet 0  . travoprost, benzalkonium, (TRAVATAN) 0.004 % ophthalmic solution Place 1 drop into both eyes at bedtime. 2.5 mL 1   No facility-administered medications prior to visit.    Allergies  Allergen Reactions  . Ppd [Tuberculin Purified Protein Derivative] Rash    Reported on 12/24/2016 by caregiver    Review of Systems  HENT: Positive for hearing loss. Negative for ear pain and facial swelling.        Fullness in ears       Objective:    Physical Exam HENT:     Right Ear: There is impacted cerumen.     Left Ear: There is impacted cerumen.     BP (!) 148/90   Pulse 88   Temp 98.9 F (37.2 C)   Resp 20   Ht 5' (1.524 m)   Wt 160 lb (72.6 kg)   SpO2 98%   BMI 31.25 kg/m  Wt Readings from Last 3 Encounters:  05/22/20 160 lb (72.6 kg)  05/16/20 159 lb (72.1 kg)  08/30/19 153 lb 12.8 oz (69.8 kg)    Health Maintenance Due  Topic Date Due  . INFLUENZA VACCINE  01/29/2020    There are no preventive care reminders to display for this patient.   Lab Results  Component Value Date   TSH 0.67 03/16/2018   Lab Results  Component Value Date   WBC 5.1 02/14/2019   HGB 13.2 02/14/2019   HCT 39.9 02/14/2019   MCV 94.8 02/14/2019   PLT 267 02/14/2019   Lab Results  Component Value Date   NA 140 02/14/2019   K 3.2 (L) 02/14/2019   CO2 22 02/14/2019   GLUCOSE 107 (H) 02/14/2019   BUN 16 02/14/2019   CREATININE 0.99 02/14/2019   BILITOT 0.8 02/14/2019   ALKPHOS 68 02/14/2019   AST 23 02/14/2019   ALT 28 02/14/2019   PROT 7.5 02/14/2019   ALBUMIN 4.5 02/14/2019   CALCIUM 9.6 02/14/2019   ANIONGAP 10 02/14/2019   Lab Results  Component Value  Date   CHOL 185 03/16/2018   Lab Results  Component Value Date   HDL 62 03/16/2018   Lab Results  Component Value Date   LDLCALC 107 (  H) 03/16/2018   Lab Results  Component Value Date   TRIG 69 03/16/2018   Lab Results  Component Value Date   CHOLHDL 3.0 03/16/2018   Lab Results  Component Value Date   HGBA1C 5.5 02/01/2010       Assessment & Plan:   Problem List Items Addressed This Visit    None    Visit Diagnoses    Impacted cerumen, bilateral    -  Primary     Cerumen Impaction -bilateral -irrigated today; after irrigation TMs and bony landmarks intact -hearing improved after irrigation   No orders of the defined types were placed in this encounter.    Noreene Larsson, NP

## 2020-05-23 ENCOUNTER — Other Ambulatory Visit: Payer: Self-pay | Admitting: Nurse Practitioner

## 2020-05-23 DIAGNOSIS — E785 Hyperlipidemia, unspecified: Secondary | ICD-10-CM

## 2020-05-23 DIAGNOSIS — E559 Vitamin D deficiency, unspecified: Secondary | ICD-10-CM | POA: Insufficient documentation

## 2020-05-23 LAB — CMP14+EGFR
ALT: 31 IU/L (ref 0–32)
AST: 19 IU/L (ref 0–40)
Albumin/Globulin Ratio: 2.2 (ref 1.2–2.2)
Albumin: 4.8 g/dL (ref 3.8–4.9)
Alkaline Phosphatase: 97 IU/L (ref 44–121)
BUN/Creatinine Ratio: 11 — ABNORMAL LOW (ref 12–28)
BUN: 13 mg/dL (ref 8–27)
Bilirubin Total: 0.2 mg/dL (ref 0.0–1.2)
CO2: 18 mmol/L — ABNORMAL LOW (ref 20–29)
Calcium: 10 mg/dL (ref 8.7–10.3)
Chloride: 106 mmol/L (ref 96–106)
Creatinine, Ser: 1.18 mg/dL — ABNORMAL HIGH (ref 0.57–1.00)
GFR calc Af Amer: 58 mL/min/{1.73_m2} — ABNORMAL LOW (ref >59)
GFR calc non Af Amer: 50 mL/min/{1.73_m2} — ABNORMAL LOW (ref >59)
Globulin, Total: 2.2 g/dL (ref 1.5–4.5)
Glucose: 97 mg/dL (ref 65–99)
Potassium: 4.3 mmol/L (ref 3.5–5.2)
Sodium: 139 mmol/L (ref 134–144)
Total Protein: 7 g/dL (ref 6.0–8.5)

## 2020-05-23 LAB — CBC WITH DIFFERENTIAL/PLATELET
Basophils Absolute: 0 10*3/uL (ref 0.0–0.2)
Basos: 1 %
EOS (ABSOLUTE): 0 10*3/uL (ref 0.0–0.4)
Eos: 1 %
Hematocrit: 42.8 % (ref 34.0–46.6)
Hemoglobin: 14.3 g/dL (ref 11.1–15.9)
Immature Grans (Abs): 0 10*3/uL (ref 0.0–0.1)
Immature Granulocytes: 0 %
Lymphocytes Absolute: 3.1 10*3/uL (ref 0.7–3.1)
Lymphs: 55 %
MCH: 32.1 pg (ref 26.6–33.0)
MCHC: 33.4 g/dL (ref 31.5–35.7)
MCV: 96 fL (ref 79–97)
Monocytes Absolute: 0.3 10*3/uL (ref 0.1–0.9)
Monocytes: 5 %
Neutrophils Absolute: 2.1 10*3/uL (ref 1.4–7.0)
Neutrophils: 38 %
Platelets: 286 10*3/uL (ref 150–450)
RBC: 4.46 x10E6/uL (ref 3.77–5.28)
RDW: 12.4 % (ref 11.7–15.4)
WBC: 5.5 10*3/uL (ref 3.4–10.8)

## 2020-05-23 LAB — LIPID PANEL WITH LDL/HDL RATIO
Cholesterol, Total: 201 mg/dL — ABNORMAL HIGH (ref 100–199)
HDL: 59 mg/dL (ref >39)
LDL Chol Calc (NIH): 130 mg/dL — ABNORMAL HIGH (ref 0–99)
LDL/HDL Ratio: 2.2 ratio (ref 0.0–3.2)
Triglycerides: 65 mg/dL (ref 0–149)
VLDL Cholesterol Cal: 12 mg/dL (ref 5–40)

## 2020-05-23 LAB — TSH: TSH: 0.834 u[IU]/mL (ref 0.450–4.500)

## 2020-05-23 LAB — VITAMIN D 25 HYDROXY (VIT D DEFICIENCY, FRACTURES): Vit D, 25-Hydroxy: 25.7 ng/mL — ABNORMAL LOW (ref 30.0–100.0)

## 2020-05-23 MED ORDER — ATORVASTATIN CALCIUM 20 MG PO TABS: 20.0000 mg | ORAL_TABLET | Freq: Every day | ORAL | 3 refills | Status: DC

## 2020-05-23 MED ORDER — VITAMIN D (ERGOCALCIFEROL) 1.25 MG (50000 UNIT) PO CAPS: 50000.0000 [IU] | ORAL_CAPSULE | ORAL | 0 refills | Status: DC

## 2020-05-23 NOTE — Progress Notes (Signed)
Her LDL is 130 and Vit D 25.7. Rx. Vit D x8 weeks and atorvastatin 20 mg. Chose lower dose lipitor because she has hx of NASH and want to check LFTs after starting this medication.  Recheck CMP in about a month.

## 2020-06-30 ENCOUNTER — Other Ambulatory Visit: Payer: Self-pay | Admitting: Gastroenterology

## 2020-06-30 ENCOUNTER — Other Ambulatory Visit: Payer: Self-pay | Admitting: Family Medicine

## 2020-07-05 NOTE — Telephone Encounter (Signed)
We sent letter to patient in August requesting she make a follow-up appointment. She will need follow-up in order to receive additional refills. Please let her know.

## 2020-07-06 ENCOUNTER — Other Ambulatory Visit: Payer: Self-pay | Admitting: Gastroenterology

## 2020-07-06 ENCOUNTER — Other Ambulatory Visit: Payer: Self-pay | Admitting: Family Medicine

## 2020-07-06 MED ORDER — PANTOPRAZOLE SODIUM 40 MG PO TBEC: 40.0000 mg | DELAYED_RELEASE_TABLET | Freq: Every day | ORAL | 2 refills | Status: DC

## 2020-07-06 NOTE — Telephone Encounter (Signed)
Spoke to [employer] pt's caregiver and was advised they never received a letter in August to schedule appt. The caregiver will call back Monday and schedule appt for the pt so she can receive her medication.

## 2020-07-06 NOTE — Telephone Encounter (Signed)
Noted.  I will send in a limited 16-monthrefill so she will have medications to get her to her appointment.

## 2020-07-09 NOTE — Telephone Encounter (Signed)
Phoned the pt and the care giver the phone just continuously rang. No vm to leave a message.

## 2020-07-10 NOTE — Telephone Encounter (Signed)
Spoke with the pt's caregiver and advised that the Rx has been sent in for the pt to continue taking her medication.

## 2020-07-31 ENCOUNTER — Other Ambulatory Visit: Payer: Self-pay | Admitting: Family Medicine

## 2020-08-01 ENCOUNTER — Telehealth: Payer: Self-pay

## 2020-08-01 ENCOUNTER — Other Ambulatory Visit: Payer: Self-pay | Admitting: Family Medicine

## 2020-08-01 MED ORDER — CLONAZEPAM 0.5 MG PO TABS: 0.5000 mg | ORAL_TABLET | Freq: Two times a day (BID) | ORAL | 5 refills | Status: DC

## 2020-08-01 NOTE — Telephone Encounter (Signed)
done

## 2020-08-01 NOTE — Telephone Encounter (Signed)
Requests klonopin refill

## 2020-08-07 ENCOUNTER — Telehealth (INDEPENDENT_AMBULATORY_CARE_PROVIDER_SITE_OTHER): Payer: Medicaid Other | Admitting: Family Medicine

## 2020-08-07 ENCOUNTER — Encounter: Payer: Self-pay | Admitting: Family Medicine

## 2020-08-07 ENCOUNTER — Other Ambulatory Visit: Payer: Self-pay

## 2020-08-07 VITALS — BP 120/77 | Ht 63.0 in | Wt 157.2 lb

## 2020-08-07 DIAGNOSIS — I10 Essential (primary) hypertension: Secondary | ICD-10-CM | POA: Diagnosis not present

## 2020-08-07 DIAGNOSIS — Z1231 Encounter for screening mammogram for malignant neoplasm of breast: Secondary | ICD-10-CM | POA: Diagnosis not present

## 2020-08-07 DIAGNOSIS — E785 Hyperlipidemia, unspecified: Secondary | ICD-10-CM

## 2020-08-07 DIAGNOSIS — F29 Unspecified psychosis not due to a substance or known physiological condition: Secondary | ICD-10-CM | POA: Diagnosis not present

## 2020-08-07 DIAGNOSIS — R32 Unspecified urinary incontinence: Secondary | ICD-10-CM

## 2020-08-07 DIAGNOSIS — E663 Overweight: Secondary | ICD-10-CM

## 2020-08-07 MED ORDER — CALCIUM CARBONATE-VITAMIN D 500-200 MG-UNIT PO TABS: 1.0000 | ORAL_TABLET | Freq: Two times a day (BID) | ORAL | 11 refills | Status: DC

## 2020-08-07 MED ORDER — UNABLE TO FIND: 11 refills | Status: DC

## 2020-08-07 NOTE — Patient Instructions (Addendum)
F/U in 6 months, flu vaccine at visit call if you need me before  Flu vaccine in office tomorrow around 2:15, has GI appt at 1:30, please accomodate  Please schedule mammogram at checkout, past due   Need to take calcium with D one tablet twice daily  Fasting cmp and EGFr, lipid panel 5 days before visit  Please reduce fried and fatty foods, cholesterol slightly high  It is important that you exercise regularly at least 30 minutes 5 times a week. If you develop chest pain, have severe difficulty breathing, or feel very tired, stop exercising immediately and seek medical attention   Thanks for choosing [facility], we consider it a privelige to serve you.   

## 2020-08-07 NOTE — Progress Notes (Signed)
Virtual Visit via Telephone Note  I connected with Abigail Hopkins on 08/07/20 at  9:00 AM EST by telephone and verified that I am speaking with the correct person using two identifiers.  Location: Patient: home Provider: office   I discussed the limitations, risks, security and privacy concerns of performing an evaluation and management service by telephone and the availability of in person appointments. I also discussed with the patient that there may be a patient responsible charge related to this service. The patient expressed understanding and agreed to proceed.   History of Present Illness: Caregiver , [employer] [place] is main historian, pt incapable F/U chronic problems and address any new or current concerns. REQUESTS incontinence supplies will need to folow through Review and update medications and allergies. Review recent lab and radiologic data . Update routine health maintainace. Review an encourage improved health habits to include nutrition, exercise and  sleep .  Denies recent fever or chills. Denies sinus pressure, nasal congestion, ear pain or sore throat. Denies chest congestion, productive cough or wheezing. Denies chest pains, palpitations and leg swelling Denies abdominal pain, nausea, vomiting,diarrhea or constipation.   Denies dysuria, frequency, hesitancy c/o  incontinence. Denies joint pain, swelling and limitation in mobility. Denies headaches, seizures, numbness, or tingling. Denies uncontrolled  depression, anxiety or insomnia. Denies skin break down or rash.       Observations/Objective: BP 120/77   Ht 5' 3"  (1.6 m)   Wt 157 lb 3.2 oz (71.3 kg)   BMI 27.85 kg/m  Good communication with no confusion and intact memory. Alert and oriented x 3 No signs of respiratory distress during speech    Assessment and Plan: ESSENTIAL HYPERTENSION, BENIGN DASH diet and commitment to daily physical activity for a minimum of 30 minutes discussed and encouraged,  as a part of hypertension management. The importance of attaining a healthy weight is also discussed.  BP/Weight 08/08/2020 08/07/2020 05/22/2020 05/16/2020 02/19/2020 07/03/863 01/05/4695  Systolic BP 295 284 132 440 102 725 366  Diastolic BP 80 77 90 81 77 82 86  Wt. (Lbs) 159.6 157.2 160 159 - 153.8 155.12  BMI 31.17 27.85 31.25 28.17 - 30.04 30.29  Controlled, no change in medication      Dyslipidemia Hyperlipidemia:Low fat diet discussed and encouraged.   Lipid Panel  Lab Results  Component Value Date   CHOL 201 (H) 05/22/2020   HDL 59 05/22/2020   LDLCALC 130 (H) 05/22/2020   TRIG 65 05/22/2020   CHOLHDL 3.0 03/16/2018     Needs to reduce fat  in diet, not at goal  Psychosis Controlled on current medication, continue same  Overweight (BMI 25.0-29.9)  Patient re-educated about  the importance of commitment to a  minimum of 150 minutes of exercise per week as able.  The importance of healthy food choices with portion control discussed, as well as eating regularly and within a 12 hour window most days. The need to choose "clean , green" food 50 to 75% of the time is discussed, as well as to make water the primary drink and set a goal of 64 ounces water daily.    Weight /BMI 08/08/2020 08/07/2020 05/22/2020  WEIGHT 159 lb 9.6 oz 157 lb 3.2 oz 160 lb  HEIGHT 5' 0"  5' 3"  5' 0"   BMI 31.17 kg/m2 27.85 kg/m2 31.25 kg/m2      Urinary incontinence rquires incontinece supplies due to uncontroled symptoms with accidents, will order    Follow Up Instructions:    I discussed the  assessment and treatment plan with the patient. The patient was provided an opportunity to ask questions and all were answered. The patient agreed with the plan and demonstrated an understanding of the instructions.   The patient was advised to call back or seek an in-person evaluation if the symptoms worsen or if the condition fails to improve as anticipated.  I provided  14 minutes of  non-face-to-face time during this encounter.   Tula Nakayama, MD

## 2020-08-08 ENCOUNTER — Telehealth: Payer: Self-pay

## 2020-08-08 ENCOUNTER — Ambulatory Visit (INDEPENDENT_AMBULATORY_CARE_PROVIDER_SITE_OTHER): Payer: Medicaid Other

## 2020-08-08 ENCOUNTER — Encounter: Payer: Self-pay | Admitting: Gastroenterology

## 2020-08-08 ENCOUNTER — Ambulatory Visit (INDEPENDENT_AMBULATORY_CARE_PROVIDER_SITE_OTHER): Payer: Medicaid Other | Admitting: Gastroenterology

## 2020-08-08 ENCOUNTER — Other Ambulatory Visit: Payer: Self-pay

## 2020-08-08 VITALS — BP 123/80 | HR 59 | Temp 97.1°F | Ht 60.0 in | Wt 159.6 lb

## 2020-08-08 DIAGNOSIS — K219 Gastro-esophageal reflux disease without esophagitis: Secondary | ICD-10-CM

## 2020-08-08 DIAGNOSIS — Z23 Encounter for immunization: Secondary | ICD-10-CM

## 2020-08-08 NOTE — Progress Notes (Signed)
Primary Care Physician: Fayrene Helper, MD  Primary Gastroenterologist:  Elon Alas. Abbey Chatters, DO   Chief Complaint  Patient presents with  . Gastroesophageal Reflux    Doing ok    HPI: Abigail Hopkins is a 60 y.o. female here for follow-up of GERD.  We requested the patient come in for evaluation as we have not seen her a couple of years.    Patient completed colonoscopy in August 2020, found to have internal and external hemorrhoids.  Next colonoscopy in August 2030.  She had a EGD in 2009 that showed chronic, mild gastritis, no H. pylori.  For GERD, she has been on pantoprazole 40 mg daily.  She lives in a group home setting.  She does not miss her medications, they are dispensed to her.  She denies any current heartburn, vomiting, problems swallowing.  Her appetite is good.  Caregiver with her today states that she occasionally complains of abdominal pain diffusely only after she has not had a bowel movement for a couple of days.  Symptoms infrequent.  No postprandial abdominal pain.  No weight loss.BMs regular for most part. No melena, brbpr.  Patient has a history of NASH seen on prior imaging in 2009.  More recent imaging in 2017 with normal-appearing liver.  Her recent LFTs have been normal.  Current Outpatient Medications  Medication Sig Dispense Refill  . atorvastatin (LIPITOR) 20 MG tablet Take 1 tablet (20 mg total) by mouth daily. 90 tablet 3  . Calcium Carb-Cholecalciferol (OYSTER SHELL CALCIUM W/D) 500-200 MG-UNIT TABS TAKE (1) TABLET BY MOUTH TWICE DAILY. 60 tablet 0  . calcium-vitamin D (OSCAL WITH D) 500-200 MG-UNIT tablet Take 1 tablet by mouth 2 (two) times daily. 60 tablet 11  . calcium-vitamin D (OSCAL WITH D) 500-200 MG-UNIT TABS tablet TAKE (1) TABLET BY MOUTH TWICE DAILY. 60 tablet 0  . clonazePAM (KLONOPIN) 0.5 MG tablet Take 1 tablet (0.5 mg total) by mouth 2 (two) times daily. 60 tablet 5  . hydrochlorothiazide (MICROZIDE) 12.5 MG capsule TAKE (1)  CAPSULE BY MOUTH ONCE DAILY. 30 capsule 0  . lisinopril (ZESTRIL) 20 MG tablet TAKE ONE TABLET BY MOUTH DAILY. 30 tablet 0  . OLANZapine (ZYPREXA) 7.5 MG tablet TAKE (1) TABLET BY MOUTH AT BEDTIME. 30 tablet 0  . pantoprazole (PROTONIX) 40 MG tablet Take 1 tablet (40 mg total) by mouth daily. 30 tablet 2  . potassium chloride (KLOR-CON) 10 MEQ tablet TAKE (1) TABLET BY MOUTH ONCE DAILY. 30 tablet 0  . topiramate (TOPAMAX) 100 MG tablet TAKE 1 TABLET BY MOUTH TWICE A DAY. 60 tablet 0  . travoprost, benzalkonium, (TRAVATAN) 0.004 % ophthalmic solution Place 1 drop into both eyes at bedtime. 2.5 mL 1  . UNABLE TO FIND Medium pullups  Underpad Weight 157lbs 100 each 11   No current facility-administered medications for this visit.    Allergies as of 08/08/2020 - Review Complete 08/08/2020  Allergen Reaction Noted  . Ppd [tuberculin purified protein derivative] Rash 12/24/2016    ROS:  General: Negative for anorexia, weight loss, fever, chills, fatigue, weakness. ENT: Negative for hoarseness, difficulty swallowing , nasal congestion. CV: Negative for chest pain, angina, palpitations, dyspnea on exertion, peripheral edema.  Respiratory: Negative for dyspnea at rest, dyspnea on exertion, cough, sputum, wheezing.  GI: See history of present illness. GU:  Negative for dysuria, hematuria, urinary incontinence, urinary frequency, nocturnal urination.  Endo: Negative for unusual weight change.    Physical Examination:   BP  123/80   Pulse (!) 59   Temp (!) 97.1 F (36.2 C) (Temporal)   Ht 5' (1.524 m)   Wt 159 lb 9.6 oz (72.4 kg)   BMI 31.17 kg/m   General: Well-nourished, well-developed in no acute distress.  Eyes: No icterus. Mouth: masked Abdomen: Bowel sounds are normal, nontender, nondistended, no hepatosplenomegaly or masses, no abdominal bruits or hernia , no rebound or guarding.   Extremities: No lower extremity edema. No clubbing or deformities. Neuro: Alert and oriented x 4    Skin: Warm and dry, no jaundice.   Psych: Alert and cooperative, normal mood and affect.  Labs:  Lab Results  Component Value Date   CREATININE 1.18 (H) 05/22/2020   BUN 13 05/22/2020   NA 139 05/22/2020   K 4.3 05/22/2020   CL 106 05/22/2020   CO2 18 (L) 05/22/2020   Lab Results  Component Value Date   ALT 31 05/22/2020   AST 19 05/22/2020   ALKPHOS 97 05/22/2020   BILITOT 0.2 05/22/2020   Lab Results  Component Value Date   WBC 5.5 05/22/2020   HGB 14.3 05/22/2020   HCT 42.8 05/22/2020   MCV 96 05/22/2020   PLT 286 05/22/2020   Lab Results  Component Value Date   TSH 0.834 05/22/2020     Imaging Studies: No results found.   Assessment/plan:  60 year old female with history of GERD, NASH.  Clinically doing well.  She has been on PPI therapy for years.  Prior EGD with mild gastritis but no esophagitis.  Would be reasonable to try stopping PPI therapy to see if this still bleeding at this time.  Discussed with caregiver, if she has any complaints of abdominal pain, frequent heartburn, decreased appetite, weight loss, nausea should let us know and we will resume pantoprazole.  History of fatty liver on previous imaging, liver appeared normal on CT in 2017.  LFTs have been normal.  Would recommend yearly LFTs with PCP.  Return to the office as needed.

## 2020-08-08 NOTE — Progress Notes (Signed)
Cc'ed to pcp °

## 2020-08-08 NOTE — Patient Instructions (Signed)
1. Stop pantoprazole (which is for acid reflux) so that we can see if you really need to still be taking the medication.  2. If you start having frequent heartburn, nausea, abdominal pain, or weight loss we will resume pantoprazole.  3. Return to the office as needed.

## 2020-08-08 NOTE — Telephone Encounter (Signed)
Abigail Hopkins with [facility] calling he states patient used to get gs-psyllium from [facility] and it stopped coming he is needing to know if patient should still be taking this and if so a new script needs to be sent to [facility] p# [phone]

## 2020-08-08 NOTE — Telephone Encounter (Signed)
I do not see this in pts med list. Is this something pt should still be taking? Please advise.

## 2020-08-12 ENCOUNTER — Telehealth: Payer: Self-pay | Admitting: Family Medicine

## 2020-08-12 ENCOUNTER — Encounter: Payer: Self-pay | Admitting: Family Medicine

## 2020-08-12 DIAGNOSIS — R32 Unspecified urinary incontinence: Secondary | ICD-10-CM | POA: Insufficient documentation

## 2020-08-12 NOTE — Assessment & Plan Note (Signed)
Controlled on current medication, continue same 

## 2020-08-12 NOTE — Assessment & Plan Note (Signed)
rquires incontinece supplies due to uncontroled symptoms with accidents, will order

## 2020-08-12 NOTE — Telephone Encounter (Signed)
From recent visit please follow through on ordering supplies, I note that the supplier was questionable in nurse's note just following through

## 2020-08-12 NOTE — Assessment & Plan Note (Signed)
Hyperlipidemia:Low fat diet discussed and encouraged.   Lipid Panel  Lab Results  Component Value Date   CHOL 201 (H) 05/22/2020   HDL 59 05/22/2020   LDLCALC 130 (H) 05/22/2020   TRIG 65 05/22/2020   CHOLHDL 3.0 03/16/2018     Needs to reduce fat  in diet, not at goal

## 2020-08-12 NOTE — Assessment & Plan Note (Signed)
  Patient re-educated about  the importance of commitment to a  minimum of 150 minutes of exercise per week as able.  The importance of healthy food choices with portion control discussed, as well as eating regularly and within a 12 hour window most days. The need to choose "clean , green" food 50 to 75% of the time is discussed, as well as to make water the primary drink and set a goal of 64 ounces water daily.    Weight /BMI 08/08/2020 08/07/2020 05/22/2020  WEIGHT 159 lb 9.6 oz 157 lb 3.2 oz 160 lb  HEIGHT 5' 0"  5' 3"  5' 0"   BMI 31.17 kg/m2 27.85 kg/m2 31.25 kg/m2

## 2020-08-12 NOTE — Assessment & Plan Note (Signed)
DASH diet and commitment to daily physical activity for a minimum of 30 minutes discussed and encouraged, as a part of hypertension management. The importance of attaining a healthy weight is also discussed.  BP/Weight 08/08/2020 08/07/2020 05/22/2020 05/16/2020 02/19/2020 03/04/3275 07/03/7090  Systolic BP 957 473 403 709 643 838 184  Diastolic BP 80 77 90 81 77 82 86  Wt. (Lbs) 159.6 157.2 160 159 - 153.8 155.12  BMI 31.17 27.85 31.25 28.17 - 30.04 30.29  Controlled, no change in medication

## 2020-08-13 NOTE — Telephone Encounter (Signed)
I faxed it to [facility] as she requested

## 2020-08-13 NOTE — Telephone Encounter (Signed)
No need to send in has not been on this for some time, no c/o constipation

## 2020-08-15 ENCOUNTER — Ambulatory Visit: Payer: Medicaid Other | Admitting: Family Medicine

## 2020-08-20 ENCOUNTER — Other Ambulatory Visit: Payer: Self-pay

## 2020-08-20 ENCOUNTER — Ambulatory Visit (HOSPITAL_COMMUNITY)
Admission: RE | Admit: 2020-08-20 | Discharge: 2020-08-20 | Disposition: A | Discharge status: Home / Self Care | Payer: Medicaid Other | Source: Ambulatory Visit | Attending: Family Medicine | Admitting: Family Medicine

## 2020-08-20 DIAGNOSIS — Z1231 Encounter for screening mammogram for malignant neoplasm of breast: Secondary | ICD-10-CM | POA: Insufficient documentation

## 2020-08-20 MED ORDER — UNABLE TO FIND: 11 refills | Status: AC

## 2020-08-31 ENCOUNTER — Other Ambulatory Visit: Payer: Self-pay | Admitting: Gastroenterology

## 2020-09-02 NOTE — Telephone Encounter (Signed)
Received refill request for Protonix. I am going to hold off on refilling this medication as Abigail Hopkins recently recommended stopping this medication at her OV in February. Patient/caregiver is to call the office with any issues with heartburn etc. Abigail Hopkins provided instructions at last OV.

## 2020-09-04 NOTE — Telephone Encounter (Signed)
Attempted to call the pt her phone just continuously rang and I was not able to leave a vm. Will try again later today or tomorrow if the pt does not return our call.

## 2020-09-05 NOTE — Telephone Encounter (Signed)
Abigail Hopkins, I spoke with the pt's caregiver [employer] [place]) and advised of the office note from [place]. The pt isn't having any issues at this time with heartburn or indigestion. Her caregiver is requesting that you DCR this medication at this time so they will not attempt to fill it or have on record that the pt is still taking it when she's been advised to stop the medication.

## 2020-09-05 NOTE — Telephone Encounter (Signed)
noted 

## 2020-09-05 NOTE — Telephone Encounter (Signed)
See other note

## 2020-09-05 NOTE — Telephone Encounter (Signed)
Dena, I am routing to [place]. I have no idea how to d/c this and haven't seen the patient. Just seen Feb 2022 and I do see where she can stop Protonix. Maybe there is a form they fax? I am not sure.

## 2020-09-21 ENCOUNTER — Other Ambulatory Visit: Payer: Self-pay | Admitting: Gastroenterology

## 2020-09-21 ENCOUNTER — Other Ambulatory Visit: Payer: Self-pay | Admitting: Family Medicine

## 2020-09-24 NOTE — Telephone Encounter (Signed)
At patient's last office visit in February, she was asked to discontinued pantoprazole to see if she still needed this medication for acid reflux.  Please call and ask patient if she is still needing pantoprazole or if she is doing well without this medication.

## 2020-09-24 NOTE — Telephone Encounter (Signed)
Can you please call the pharmacy to ask what we need to do to discontinue this medication? If they send [place] a form to sign, recommend Magda Paganini sign as she last saw patient and gave the recommendation to hold Protonix.   Brett Fairy

## 2020-09-24 NOTE — Telephone Encounter (Signed)
Hey remember back a few weeks ago on 08/31/2020 I had spoke with the pt's caregiver Abigail Hopkins) and she advised me then that the pt did not take this medication anymore and the needed a discontinue medication for the records in order to get this off the pt's medication list. Abigail Hopkins did not know how to discontinue it so that's why we got this again until a discontinue note is put in for this.

## 2020-09-25 ENCOUNTER — Telehealth: Payer: Self-pay

## 2020-09-25 NOTE — Telephone Encounter (Signed)
Per Neil Crouch please D/C this pt's Rx for Pantoprazole 71m tablet.      Thank you,  ZOleh Genin CMA    LMahala Menghini PA-C

## 2020-09-25 NOTE — Telephone Encounter (Signed)
See other note

## 2020-09-25 NOTE — Telephone Encounter (Signed)
Noted! Thank you

## 2020-09-25 NOTE — Telephone Encounter (Signed)
Letter done to D/C Rx (Pantoprazole 40 mg). Faxed to [facility] door.

## 2020-10-17 ENCOUNTER — Other Ambulatory Visit: Payer: Self-pay | Admitting: Nurse Practitioner

## 2020-10-22 ENCOUNTER — Telehealth: Payer: Self-pay

## 2020-10-22 NOTE — Telephone Encounter (Signed)
FL2 form  Noted Copied Sleeved

## 2020-12-24 ENCOUNTER — Other Ambulatory Visit: Payer: Self-pay | Admitting: Family Medicine

## 2021-01-09 ENCOUNTER — Other Ambulatory Visit: Payer: Self-pay | Admitting: Family Medicine

## 2021-01-24 ENCOUNTER — Other Ambulatory Visit: Payer: Self-pay | Admitting: Family Medicine

## 2021-01-29 ENCOUNTER — Other Ambulatory Visit: Payer: Self-pay | Admitting: Family Medicine

## 2021-01-29 ENCOUNTER — Telehealth: Payer: Self-pay

## 2021-01-29 MED ORDER — CLONAZEPAM 0.5 MG PO TABS: 0.5000 mg | ORAL_TABLET | Freq: Two times a day (BID) | ORAL | 0 refills | Status: DC | PRN

## 2021-01-29 NOTE — Telephone Encounter (Signed)
Doren Custard from [facility] called to clarify on a script ph# [phone]

## 2021-01-29 NOTE — Telephone Encounter (Signed)
Doren Custard called from [facility] called needs clarification on couple of medicines that was sent in. Call back # [phone]

## 2021-01-30 ENCOUNTER — Telehealth: Payer: Self-pay | Admitting: Family Medicine

## 2021-01-30 NOTE — Telephone Encounter (Signed)
Abigail Hopkins informed that metamucil will be d/c'd if caregiver states she is not using this. Will verify with caregiver.

## 2021-01-30 NOTE — Telephone Encounter (Signed)
Caregiver states that she is not using metamucil and does not need it at this time.

## 2021-01-30 NOTE — Telephone Encounter (Signed)
See previous message

## 2021-01-30 NOTE — Telephone Encounter (Signed)
Pharmacy wants to know if she is supposed to still be on metamucil. If not they need a d/c order faxed to them

## 2021-01-30 NOTE — Telephone Encounter (Signed)
Please call Doren Custard regarding Refill for Metamucil

## 2021-01-31 ENCOUNTER — Telehealth: Payer: Self-pay

## 2021-01-31 ENCOUNTER — Other Ambulatory Visit: Payer: Self-pay

## 2021-01-31 MED ORDER — CLONAZEPAM 0.5 MG PO TABS: 0.5000 mg | ORAL_TABLET | Freq: Two times a day (BID) | ORAL | 0 refills | Status: DC

## 2021-01-31 NOTE — Telephone Encounter (Signed)
Called facility and lvm to call back. According to records the clonazepam was not changed

## 2021-01-31 NOTE — Telephone Encounter (Signed)
D/c for metamucil sent

## 2021-01-31 NOTE — Telephone Encounter (Signed)
Med printed  without prn and will fax to [facility]

## 2021-01-31 NOTE — Telephone Encounter (Signed)
Abigail Hopkins from [facility] called questioning why the Clonazepam medication was changed callback is [phone]

## 2021-02-01 NOTE — Telephone Encounter (Signed)
Rx sent 

## 2021-02-01 NOTE — Telephone Encounter (Signed)
Lyndle Herrlich called from [facility] said [place] did not get the updated prescription on Clonazepam, call back # [phone]

## 2021-02-05 ENCOUNTER — Ambulatory Visit: Payer: Medicaid Other | Admitting: Family Medicine

## 2021-02-12 ENCOUNTER — Encounter: Payer: Self-pay | Admitting: Family Medicine

## 2021-02-12 ENCOUNTER — Other Ambulatory Visit: Payer: Self-pay

## 2021-02-12 ENCOUNTER — Ambulatory Visit (INDEPENDENT_AMBULATORY_CARE_PROVIDER_SITE_OTHER): Payer: Medicaid Other | Admitting: Family Medicine

## 2021-02-12 VITALS — BP 108/72 | HR 76 | Resp 15 | Ht 60.0 in | Wt 153.8 lb

## 2021-02-12 DIAGNOSIS — E785 Hyperlipidemia, unspecified: Secondary | ICD-10-CM

## 2021-02-12 DIAGNOSIS — E663 Overweight: Secondary | ICD-10-CM

## 2021-02-12 DIAGNOSIS — E538 Deficiency of other specified B group vitamins: Secondary | ICD-10-CM

## 2021-02-12 DIAGNOSIS — K7581 Nonalcoholic steatohepatitis (NASH): Secondary | ICD-10-CM

## 2021-02-12 DIAGNOSIS — K219 Gastro-esophageal reflux disease without esophagitis: Secondary | ICD-10-CM

## 2021-02-12 DIAGNOSIS — E559 Vitamin D deficiency, unspecified: Secondary | ICD-10-CM

## 2021-02-12 DIAGNOSIS — E669 Obesity, unspecified: Secondary | ICD-10-CM

## 2021-02-12 DIAGNOSIS — F29 Unspecified psychosis not due to a substance or known physiological condition: Secondary | ICD-10-CM

## 2021-02-12 DIAGNOSIS — I10 Essential (primary) hypertension: Secondary | ICD-10-CM

## 2021-02-12 DIAGNOSIS — R413 Other amnesia: Secondary | ICD-10-CM | POA: Diagnosis not present

## 2021-02-12 DIAGNOSIS — E66811 Obesity, class 1: Secondary | ICD-10-CM

## 2021-02-12 DIAGNOSIS — R410 Disorientation, unspecified: Secondary | ICD-10-CM

## 2021-02-12 NOTE — Assessment & Plan Note (Signed)
  Patient re-educated about  the importance of commitment to a  minimum of 150 minutes of exercise per week as able.  The importance of healthy food choices with portion control discussed, as well as eating regularly and within a 12 hour window most days. The need to choose "clean , green" food 50 to 75% of the time is discussed, as well as to make water the primary drink and set a goal of 64 ounces water daily.    Weight /BMI 02/12/2021 08/08/2020 08/07/2020  WEIGHT 153 lb 12.8 oz 159 lb 9.6 oz 157 lb 3.2 oz  HEIGHT 5' 0"  5' 0"  5' 3"   BMI 30.04 kg/m2 31.17 kg/m2 27.85 kg/m2

## 2021-02-12 NOTE — Assessment & Plan Note (Signed)
Controlled, no change in medication  

## 2021-02-12 NOTE — Assessment & Plan Note (Signed)
Updated lab needed at/ before next visit.   

## 2021-02-12 NOTE — Progress Notes (Signed)
Abigail Hopkins     MRN: [id number]      DOB: 1961-06-20   HPI Abigail Hopkins is here for follow up and re-evaluation of chronic medical conditions, medication management and review of any available recent lab and radiology data.  Preventive health is updated, specifically  Cancer screening and Immunization.   Questions or concerns regarding consultations or procedures which the PT has had in the interim are  addressed. The PT denies any adverse reactions to current medications since the last visit.  C/o increased forgetfulness nd mild confusion over past several months, worsening ROS Denies recent fever or chills. Denies sinus pressure, nasal congestion, ear pain or sore throat. Denies chest congestion, productive cough or wheezing. Denies chest pains, palpitations and leg swelling Denies abdominal pain, nausea, vomiting,diarrhea or constipation.   Denies dysuria, frequency, hesitancy or incontinence. Denies joint pain, swelling and limitation in mobility. Denies headaches, seizures, numbness, or tingling. Denies uncontrolled  depression, anxiety or insomnia. Denies skin break down or rash.   PE  BP 108/72   Pulse 76   Resp 15   Ht 5' (1.524 m)   Wt 153 lb 12.8 oz (69.8 kg)   SpO2 95%   BMI 30.04 kg/m   Patient alert and oriented and in no cardiopulmonary distress.  HEENT: No facial asymmetry, EOMI,     Neck supple .  Chest: Clear to auscultation bilaterally.  CVS: S1, S2 no murmurs, no S3.Regular rate.  ABD: Soft non tender.   Ext: No edema  MS: Adequate ROM spine, shoulders, hips and knees.  Skin: Intact, no ulcerations or rash noted.  Psych: Good eye contact, not anxious or depressed appearing.  CNS: CN 2-12 intact, power,  normal throughout.no focal deficits noted.   Assessment & Plan  ESSENTIAL HYPERTENSION, BENIGN Controlled, no change in medication DASH diet and commitment to daily physical activity for a minimum of 30 minutes discussed and encouraged, as a  part of hypertension management. The importance of attaining a healthy weight is also discussed.  BP/Weight 02/12/2021 08/08/2020 08/07/2020 05/22/2020 05/16/2020 5/44/9201 0/0/7121  Systolic BP 975 883 254 982 641 583 094  Diastolic BP 72 80 77 90 81 77 82  Wt. (Lbs) 153.8 159.6 157.2 160 159 - 153.8  BMI 30.04 31.17 27.85 31.25 28.17 - 30.04       Dyslipidemia Hyperlipidemia:Low fat diet discussed and encouraged.   Lipid Panel  Lab Results  Component Value Date   CHOL 201 (H) 05/22/2020   HDL 59 05/22/2020   LDLCALC 130 (H) 05/22/2020   TRIG 65 05/22/2020   CHOLHDL 3.0 03/16/2018     Updated lab needed at/ before next visit.   Overweight (BMI 25.0-29.9)  Patient re-educated about  the importance of commitment to a  minimum of 150 minutes of exercise per week as able.  The importance of healthy food choices with portion control discussed, as well as eating regularly and within a 12 hour window most days. The need to choose "clean , green" food 50 to 75% of the time is discussed, as well as to make water the primary drink and set a goal of 64 ounces water daily.    Weight /BMI 02/12/2021 08/08/2020 08/07/2020  WEIGHT 153 lb 12.8 oz 159 lb 9.6 oz 157 lb 3.2 oz  HEIGHT 5' 0"  5' 0"  5' 3"   BMI 30.04 kg/m2 31.17 kg/m2 27.85 kg/m2      GERD Controlled, no change in medication   Psychosis Controlled, no change in medication  Vitamin D deficiency Updated lab needed at/ before next visit.   Memory loss of unknown cause Needs brain scan and will get MMSE at next visit, reportedly getting confused even with bathing, forgetting simple instructions given, noticeable deterioration in recent 8 months

## 2021-02-12 NOTE — Assessment & Plan Note (Signed)
Controlled, no change in medication DASH diet and commitment to daily physical activity for a minimum of 30 minutes discussed and encouraged, as a part of hypertension management. The importance of attaining a healthy weight is also discussed.  BP/Weight 02/12/2021 08/08/2020 08/07/2020 05/22/2020 05/16/2020 2/53/6644 0/08/4740  Systolic BP 595 638 756 433 295 188 416  Diastolic BP 72 80 77 90 81 77 82  Wt. (Lbs) 153.8 159.6 157.2 160 159 - 153.8  BMI 30.04 31.17 27.85 31.25 28.17 - 30.04

## 2021-02-12 NOTE — Assessment & Plan Note (Signed)
Needs brain scan and will get MMSE at next visit, reportedly getting confused even with bathing, forgetting simple instructions given, noticeable deterioration in recent 8 months

## 2021-02-12 NOTE — Assessment & Plan Note (Signed)
Hyperlipidemia:Low fat diet discussed and encouraged.   Lipid Panel  Lab Results  Component Value Date   CHOL 201 (H) 05/22/2020   HDL 59 05/22/2020   LDLCALC 130 (H) 05/22/2020   TRIG 65 05/22/2020   CHOLHDL 3.0 03/16/2018     Updated lab needed at/ before next visit.

## 2021-02-12 NOTE — Patient Instructions (Addendum)
F/U with MMSE in 6 to 8 weeks, call if you need me sooner  Fasting lipid, cmp and eGFr, CBC, TSH, vit D, B12  level 5 to 7 days before next visit  You are referred for brain scan , we will call with appointment information  Please start the shingrix vaccines asap  Thanks for choosing [facility], we consider it a privelige to serve you.

## 2021-02-22 ENCOUNTER — Other Ambulatory Visit: Payer: Self-pay | Admitting: Family Medicine

## 2021-02-22 ENCOUNTER — Other Ambulatory Visit: Payer: Self-pay

## 2021-02-22 ENCOUNTER — Ambulatory Visit (HOSPITAL_COMMUNITY)
Admission: RE | Admit: 2021-02-22 | Discharge: 2021-02-22 | Disposition: A | Discharge status: Home / Self Care | Payer: Medicaid Other | Source: Ambulatory Visit | Attending: Family Medicine | Admitting: Family Medicine

## 2021-02-22 DIAGNOSIS — R410 Disorientation, unspecified: Secondary | ICD-10-CM | POA: Insufficient documentation

## 2021-02-22 DIAGNOSIS — R413 Other amnesia: Secondary | ICD-10-CM | POA: Diagnosis not present

## 2021-02-25 ENCOUNTER — Other Ambulatory Visit: Payer: Self-pay | Admitting: Family Medicine

## 2021-02-26 ENCOUNTER — Other Ambulatory Visit: Payer: Self-pay | Admitting: Family Medicine

## 2021-02-26 MED ORDER — CLONAZEPAM 0.5 MG PO TABS: 0.5000 mg | ORAL_TABLET | Freq: Two times a day (BID) | ORAL | 5 refills | Status: DC

## 2021-03-18 ENCOUNTER — Telehealth: Payer: Self-pay

## 2021-03-18 NOTE — Telephone Encounter (Signed)
Altamese Pink calling for results. Call back # [phone]

## 2021-03-19 NOTE — Telephone Encounter (Signed)
Left message

## 2021-03-29 ENCOUNTER — Other Ambulatory Visit: Payer: Self-pay | Admitting: Family Medicine

## 2021-04-02 NOTE — Telephone Encounter (Signed)
Mailed results to patient

## 2021-04-04 ENCOUNTER — Ambulatory Visit: Payer: Medicaid Other | Admitting: Family Medicine

## 2021-04-24 ENCOUNTER — Other Ambulatory Visit: Payer: Self-pay | Admitting: Family Medicine

## 2021-04-29 ENCOUNTER — Other Ambulatory Visit: Payer: Self-pay | Admitting: Family Medicine

## 2021-05-01 ENCOUNTER — Other Ambulatory Visit: Payer: Self-pay | Admitting: Family Medicine

## 2021-05-29 ENCOUNTER — Other Ambulatory Visit: Payer: Self-pay | Admitting: Family Medicine

## 2021-06-24 ENCOUNTER — Other Ambulatory Visit: Payer: Self-pay | Admitting: Family Medicine

## 2021-07-17 ENCOUNTER — Other Ambulatory Visit (HOSPITAL_COMMUNITY): Payer: Self-pay | Admitting: Family Medicine

## 2021-07-17 ENCOUNTER — Other Ambulatory Visit (HOSPITAL_COMMUNITY): Payer: Self-pay | Admitting: Obstetrics & Gynecology

## 2021-07-17 DIAGNOSIS — Z1231 Encounter for screening mammogram for malignant neoplasm of breast: Secondary | ICD-10-CM

## 2021-08-23 ENCOUNTER — Ambulatory Visit (HOSPITAL_COMMUNITY)
Admission: RE | Admit: 2021-08-23 | Discharge: 2021-08-23 | Disposition: A | Discharge status: Home / Self Care | Payer: Medicaid Other | Source: Ambulatory Visit | Attending: Family Medicine | Admitting: Family Medicine

## 2021-08-23 ENCOUNTER — Other Ambulatory Visit: Payer: Self-pay

## 2021-08-23 DIAGNOSIS — Z1231 Encounter for screening mammogram for malignant neoplasm of breast: Secondary | ICD-10-CM | POA: Diagnosis present

## 2021-08-29 ENCOUNTER — Other Ambulatory Visit: Payer: Self-pay | Admitting: Family Medicine

## 2021-09-26 ENCOUNTER — Other Ambulatory Visit: Payer: Self-pay | Admitting: Family Medicine

## 2021-10-24 ENCOUNTER — Other Ambulatory Visit: Payer: Self-pay | Admitting: Family Medicine

## 2021-12-26 ENCOUNTER — Other Ambulatory Visit: Payer: Self-pay | Admitting: Family Medicine

## 2022-02-06 IMAGING — MR MR HEAD W/O CM
12 series · 48 of 48 positions shown · non-contrast
Comparison: Head CT 10/10/2004 and MRI 08/07/2004

CLINICAL DATA: Memory loss.  Confusion.

EXAM:
MRI HEAD WITHOUT CONTRAST
TECHNIQUE: Multiplanar, multiecho pulse sequences of the brain and surrounding
structures were obtained without intravenous contrast.

[Series 5: DWI · axial · 3.0mm · 0.77mm/px · z∈[-46,+92]mm · 3 of 50 slices shown (1 of 4)]
[im 1/50]
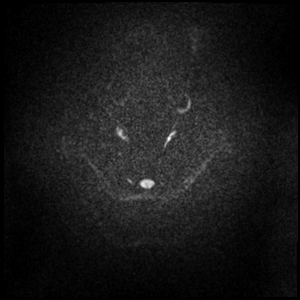
[im 25/50]
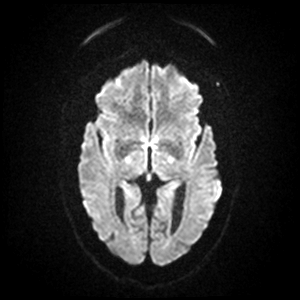
[im 50/50]
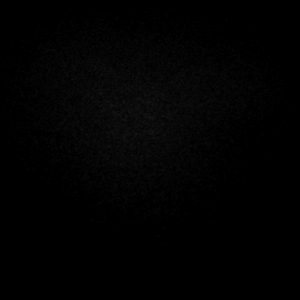

[Series 6: DWI · axial · 3.0mm · 0.77mm/px · z∈[-46,+81]mm · 4 of 46 slices shown (2 of 4)]
[im 1/46]
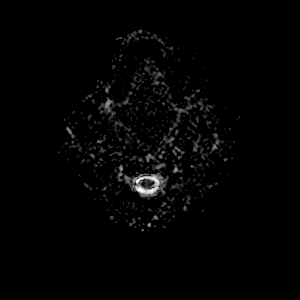
[im 16/46]
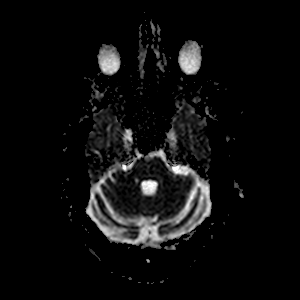
[im 31/46]
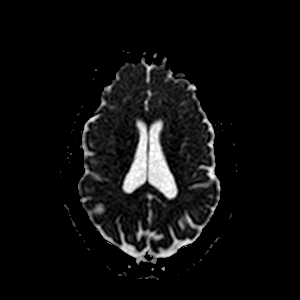
[im 46/46]
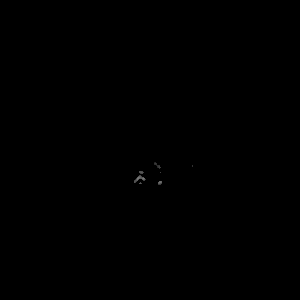

[Series 7: DWI · coronal · 5.0mm · 0.88mm/px · 2 of 28 slices shown (3 of 4)]
[im 1/28]
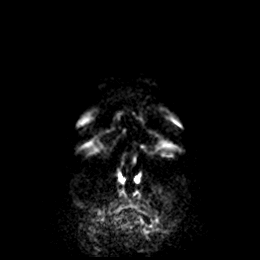
[im 28/28]
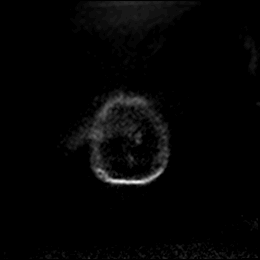

[Series 8: DWI · coronal · 5.0mm · 0.88mm/px · 2 of 28 slices shown (4 of 4)]
[im 1/28]
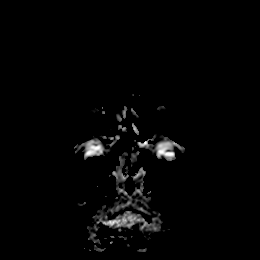
[im 28/28]
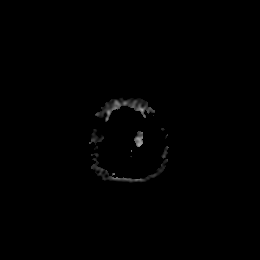

[Series 9: T1 · sagittal · 5.0mm · 0.75mm/px · 2 of 21 slices shown (1 of 2)]
[im 1/21]
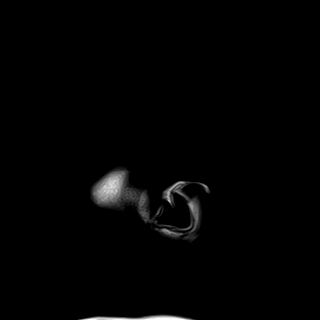
[im 21/21]
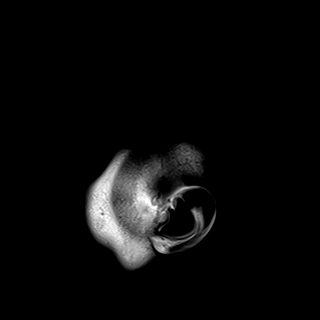

[Series 10: T2 · axial · 5.0mm · 0.72mm/px · z∈[-43,+89]mm · 2 of 21 slices shown (1 of 2)]
[im 1/21]
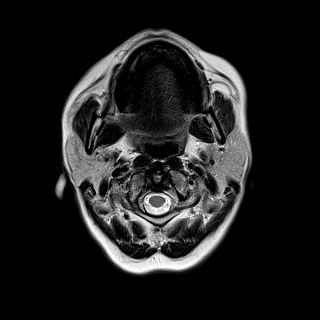
[im 21/21]
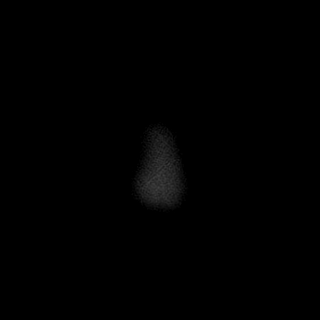

[Series 11: mag_images · axial · 3.0mm · 0.90mm/px · z∈[-56,+99]mm · 5 of 56 slices shown]
[im 1/56]
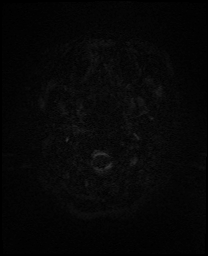
[im 14/56]
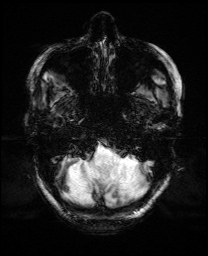
[im 28/56]
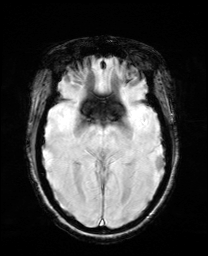
[im 42/56]
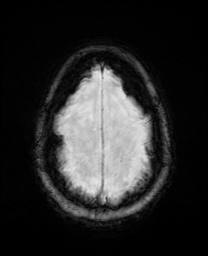
[im 56/56]
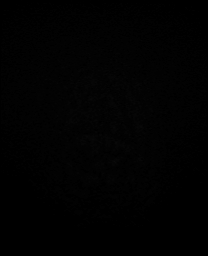

[Series 12: pha_images · axial · 3.0mm · 0.90mm/px · z∈[-56,+96]mm · 4 of 53 slices shown]
[im 1/53]
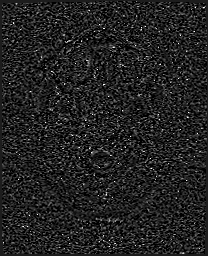
[im 18/53]
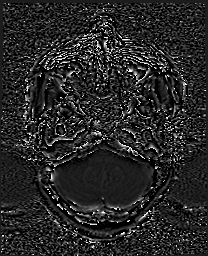
[im 35/53]
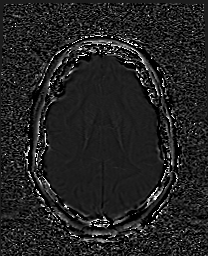
[im 53/53]
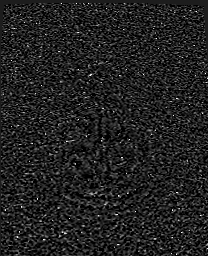

[Series 13: swi_images · axial · 3.0mm · 0.90mm/px · z∈[-56,+99]mm · 5 of 56 slices shown]
[im 1/56]
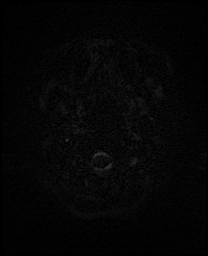
[im 14/56]
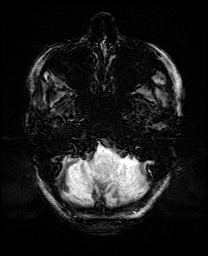
[im 28/56]
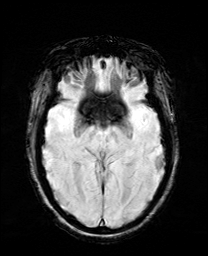
[im 42/56]
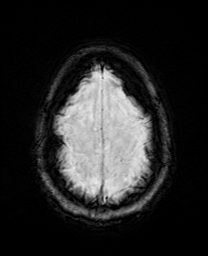
[im 56/56]
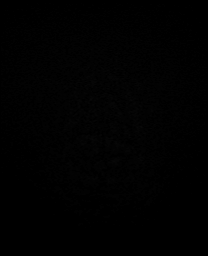

[Series 15: FLAIR · axial · 3.0mm · 0.45mm/px · z∈[-45,+87]mm · 4 of 48 slices shown]
[im 1/48]
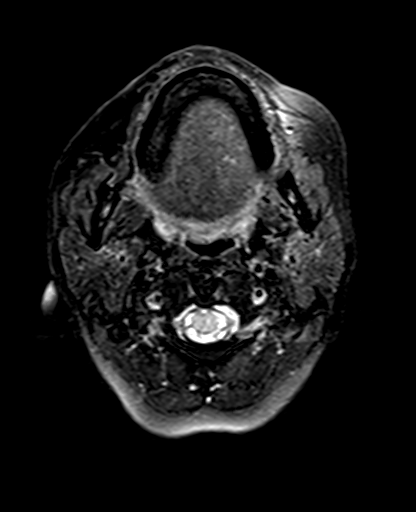
[im 16/48]
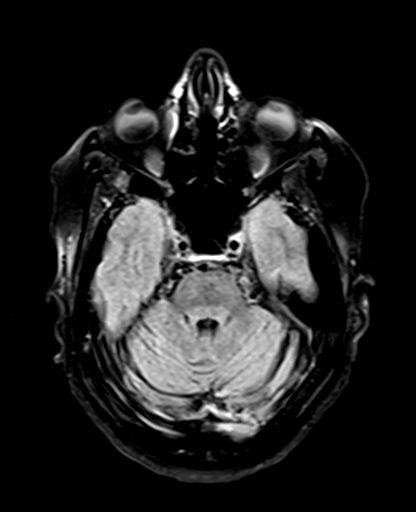
[im 32/48]
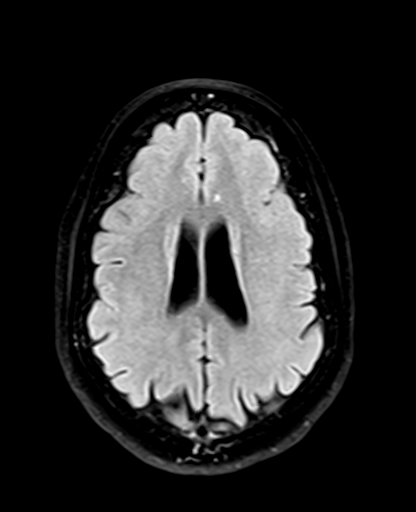
[im 48/48]
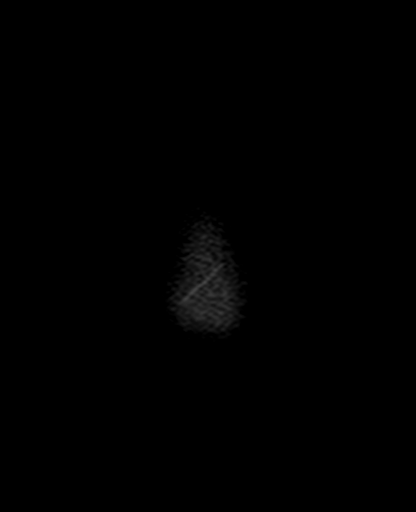

[Series 16: T1 · axial · 1.0mm · 0.98mm/px · z∈[-48,+86]mm · 12 of 144 slices shown (2 of 2)]
[im 1/144]
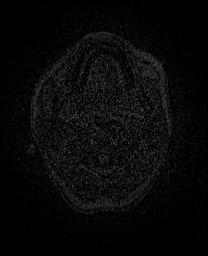
[im 14/144]
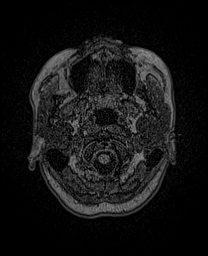
[im 27/144]
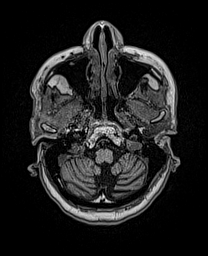
[im 40/144]
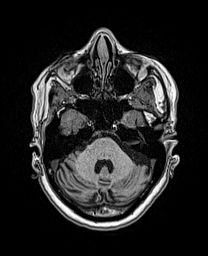
[im 53/144]
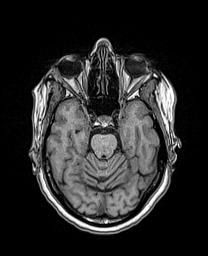
[im 66/144]
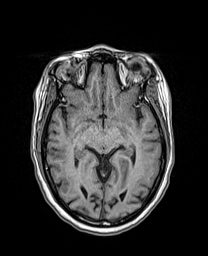
[im 79/144]
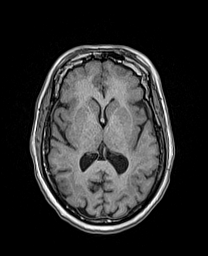
[im 92/144]
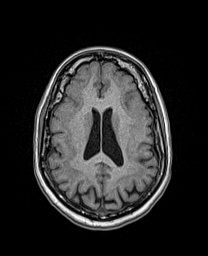
[im 105/144]
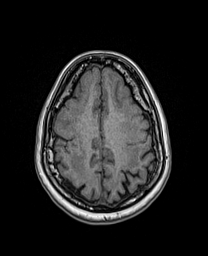
[im 118/144]
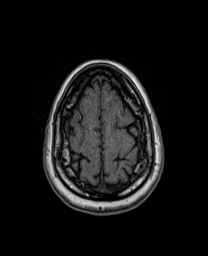
[im 131/144]
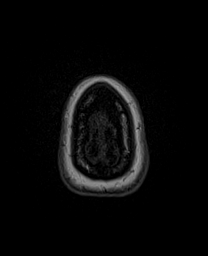
[im 144/144]
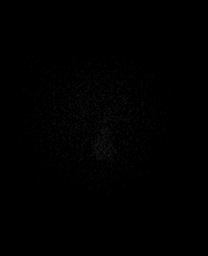

[Series 17: T2 · coronal · 5.0mm · 0.72mm/px · 3 of 34 slices shown (2 of 2)]
[im 1/34]
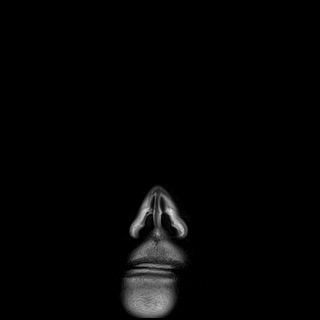
[im 17/34]
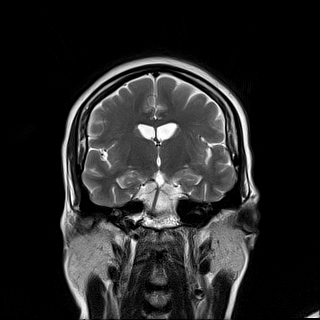
[im 34/34]
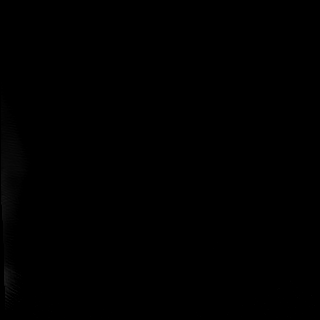

[48 of 48 positions shown; findings below may reference images not displayed]

FINDINGS: Brain: There is no evidence of an acute infarct, intracranial
hemorrhage, mass, midline shift, or extra-axial fluid collection. A
few small foci of T2 hyperintensity in the cerebral white matter are
nonspecific and not considered abnormal for age. Moderate cerebellar
atrophy is new. Cerebral volume is within normal limits for age.

Vascular: Major intracranial vascular flow voids are preserved.

Skull and upper cervical spine: Unremarkable bone marrow signal.

Sinuses/Orbits: Unremarkable orbits. Paranasal sinuses and mastoid
air cells are clear.

Other: None.
IMPRESSION: 1. No acute intracranial abnormality.
2. Moderate cerebellar atrophy.

## 2022-02-28 ENCOUNTER — Other Ambulatory Visit: Payer: Self-pay | Admitting: Family Medicine

## 2022-07-10 ENCOUNTER — Encounter: Payer: Self-pay | Admitting: Adult Health

## 2022-07-10 ENCOUNTER — Other Ambulatory Visit (HOSPITAL_COMMUNITY)
Admission: RE | Admit: 2022-07-10 | Discharge: 2022-07-10 | Disposition: A | Discharge status: Home / Self Care | Payer: Medicaid Other | Source: Ambulatory Visit | Attending: Adult Health | Admitting: Adult Health

## 2022-07-10 ENCOUNTER — Ambulatory Visit (INDEPENDENT_AMBULATORY_CARE_PROVIDER_SITE_OTHER): Payer: Medicaid Other | Admitting: Adult Health

## 2022-07-10 VITALS — BP 130/81 | HR 57 | Ht 60.0 in | Wt 136.0 lb

## 2022-07-10 DIAGNOSIS — N898 Other specified noninflammatory disorders of vagina: Secondary | ICD-10-CM | POA: Insufficient documentation

## 2022-07-10 DIAGNOSIS — N952 Postmenopausal atrophic vaginitis: Secondary | ICD-10-CM

## 2022-07-10 DIAGNOSIS — Z90710 Acquired absence of both cervix and uterus: Secondary | ICD-10-CM | POA: Insufficient documentation

## 2022-07-10 NOTE — Progress Notes (Signed)
  Subjective:     Patient ID: Abigail Hopkins, female   DOB: Sep 28, 1960, 62 y.o.   MRN: [id number]  HPI Abigail Hopkins is a 62 year old black female, widowed, sp hysterectomy years ago, complaining of brown discharge for over a year, no itching,burning or odor.   Review of [place] vaginal discharge for over a year Denies any itching,burning or odor, or dryness Reviewed past medical,surgical, social and family history. Reviewed medications and allergies.     Objective:   Physical Exam BP 130/81 (BP Location: Left Arm, Patient Position: Sitting, Cuff Size: Normal)   Pulse (!) 57   Ht 5' (1.524 m)   Wt 136 lb (61.7 kg)   BMI 26.56 kg/m     Skin warm and dry.Pelvic: external genitalia is normal in appearance no lesions, vagina:atrophic, scant discharge, no odor, used small Pederson speculum,urethra has no lesions or masses noted, cervix and uterus absent,adnexa: no masses or tenderness noted. Bladder is non tender and no masses felt. CV swab obtained. Fall risk is low  Upstream - 07/10/22 1207       Pregnancy Intention Screening   Does the patient want to become pregnant in the next year? N/A    Does the patient's partner want to become pregnant in the next year? N/A    Would the patient like to discuss contraceptive options today? N/A      Contraception Wrap Up   Current Method Female Sterilization   hyst   End Method Female Sterilization   hyst   Contraception Counseling Provided No            Examination chaperoned by Levy Pupa LPN  Assessment:   1. Vaginal discharge Has had brown discharge for over a year No itching or burning, does not feel dry No odor CV swab sent for BV,trich and yeast  2. S/P hysterectomy  3. Vaginal atrophy  Try not to wipe or wash to hard  If vaginal swab negative, consider replens or vaginal estrogen     Plan:     Follow up prn

## 2022-07-13 LAB — CERVICOVAGINAL ANCILLARY ONLY
Bacterial Vaginitis (gardnerella): NEGATIVE
Candida Glabrata: NEGATIVE
Candida Vaginitis: NEGATIVE
Comment: NEGATIVE
Comment: NEGATIVE
Comment: NEGATIVE
Comment: NEGATIVE
Trichomonas: NEGATIVE

## 2022-07-14 ENCOUNTER — Telehealth: Payer: Self-pay | Admitting: *Deleted

## 2022-07-14 NOTE — Telephone Encounter (Signed)
-----  Message from Estill Dooms, NP sent at 07/14/2022  9:52 AM EST ----- Can you let her know negative, BV and trich and yeast, try Replens OTC

## 2022-07-14 NOTE — Telephone Encounter (Signed)
Pt's caretaker aware swab was negative and can try OTC Replens. Caretaker voiced understanding. [place]

## 2022-10-28 ENCOUNTER — Other Ambulatory Visit (HOSPITAL_COMMUNITY): Payer: Self-pay | Admitting: Family Medicine

## 2022-10-28 DIAGNOSIS — Z1231 Encounter for screening mammogram for malignant neoplasm of breast: Secondary | ICD-10-CM

## 2022-11-12 ENCOUNTER — Ambulatory Visit (HOSPITAL_COMMUNITY)
Admission: RE | Admit: 2022-11-12 | Discharge: 2022-11-12 | Disposition: A | Discharge status: Home / Self Care | Payer: Medicaid Other | Source: Ambulatory Visit | Attending: Family Medicine | Admitting: Family Medicine

## 2022-11-12 DIAGNOSIS — Z1231 Encounter for screening mammogram for malignant neoplasm of breast: Secondary | ICD-10-CM | POA: Insufficient documentation
